# Patient Record
Sex: Female | Born: 1960 | Race: White | Marital: Married | Smoking: Never smoker
Health system: Southern US, Community
[De-identification: ages and names within clinical notes are randomized; demographics above are authoritative.]

## PROBLEM LIST (undated history)

## (undated) DIAGNOSIS — Z83719 Family history of colon polyps, unspecified: Secondary | ICD-10-CM

## (undated) DIAGNOSIS — Z8041 Family history of malignant neoplasm of ovary: Secondary | ICD-10-CM

## (undated) DIAGNOSIS — Z800 Family history of malignant neoplasm of digestive organs: Secondary | ICD-10-CM

## (undated) DIAGNOSIS — E559 Vitamin D deficiency, unspecified: Secondary | ICD-10-CM

## (undated) DIAGNOSIS — Z8371 Family history of colonic polyps: Secondary | ICD-10-CM

## (undated) DIAGNOSIS — J45909 Unspecified asthma, uncomplicated: Secondary | ICD-10-CM

## (undated) HISTORY — DX: Family history of malignant neoplasm of digestive organs: Z80.0

## (undated) HISTORY — DX: Vitamin D deficiency, unspecified: E55.9

## (undated) HISTORY — DX: Family history of colonic polyps: Z83.71

## (undated) HISTORY — DX: Unspecified asthma, uncomplicated: J45.909

## (undated) HISTORY — DX: Family history of colon polyps, unspecified: Z83.719

---
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
## 1990-01-11 HISTORY — PX: AUGMENTATION MAMMAPLASTY: SUR837

## 2013-08-29 DIAGNOSIS — Z531 Procedure and treatment not carried out because of patient's decision for reasons of belief and group pressure: Secondary | ICD-10-CM

## 2013-08-29 DIAGNOSIS — Z789 Other specified health status: Secondary | ICD-10-CM | POA: Insufficient documentation

## 2013-08-29 HISTORY — DX: Procedure and treatment not carried out because of patient's decision for reasons of belief and group pressure: Z53.1

## 2013-08-29 HISTORY — DX: Other specified health status: Z78.9

## 2015-05-05 DIAGNOSIS — M19071 Primary osteoarthritis, right ankle and foot: Secondary | ICD-10-CM

## 2015-05-05 HISTORY — DX: Primary osteoarthritis, right ankle and foot: M19.071

## 2015-09-29 ENCOUNTER — Other Ambulatory Visit: Payer: Self-pay | Admitting: Student

## 2015-09-29 DIAGNOSIS — Z1239 Encounter for other screening for malignant neoplasm of breast: Secondary | ICD-10-CM

## 2017-03-21 DIAGNOSIS — E785 Hyperlipidemia, unspecified: Secondary | ICD-10-CM

## 2017-03-21 DIAGNOSIS — R059 Cough, unspecified: Secondary | ICD-10-CM

## 2017-03-21 DIAGNOSIS — K5900 Constipation, unspecified: Secondary | ICD-10-CM

## 2017-03-21 DIAGNOSIS — R030 Elevated blood-pressure reading, without diagnosis of hypertension: Secondary | ICD-10-CM | POA: Insufficient documentation

## 2017-03-21 DIAGNOSIS — J309 Allergic rhinitis, unspecified: Secondary | ICD-10-CM

## 2017-03-21 HISTORY — DX: Constipation, unspecified: K59.00

## 2017-03-21 HISTORY — DX: Allergic rhinitis, unspecified: J30.9

## 2017-03-21 HISTORY — DX: Elevated blood-pressure reading, without diagnosis of hypertension: R03.0

## 2017-03-21 HISTORY — DX: Hyperlipidemia, unspecified: E78.5

## 2017-03-21 HISTORY — DX: Cough, unspecified: R05.9

## 2017-05-08 DIAGNOSIS — Z1211 Encounter for screening for malignant neoplasm of colon: Secondary | ICD-10-CM | POA: Insufficient documentation

## 2017-05-08 HISTORY — DX: Encounter for screening for malignant neoplasm of colon: Z12.11

## 2017-07-27 DIAGNOSIS — R42 Dizziness and giddiness: Secondary | ICD-10-CM | POA: Insufficient documentation

## 2017-07-27 HISTORY — DX: Dizziness and giddiness: R42

## 2017-09-26 DIAGNOSIS — S92919A Unspecified fracture of unspecified toe(s), initial encounter for closed fracture: Secondary | ICD-10-CM

## 2017-09-26 DIAGNOSIS — M12579 Traumatic arthropathy, unspecified ankle and foot: Secondary | ICD-10-CM

## 2017-09-26 HISTORY — DX: Unspecified fracture of unspecified toe(s), initial encounter for closed fracture: S92.919A

## 2017-09-26 HISTORY — DX: Traumatic arthropathy, unspecified ankle and foot: M12.579

## 2017-11-15 ENCOUNTER — Other Ambulatory Visit: Payer: Self-pay | Admitting: Student

## 2017-11-15 DIAGNOSIS — Z1231 Encounter for screening mammogram for malignant neoplasm of breast: Secondary | ICD-10-CM

## 2017-11-30 ENCOUNTER — Ambulatory Visit: Payer: PRIVATE HEALTH INSURANCE | Attending: Neurology

## 2017-11-30 DIAGNOSIS — G4733 Obstructive sleep apnea (adult) (pediatric): Secondary | ICD-10-CM | POA: Insufficient documentation

## 2018-01-11 HISTORY — PX: COLONOSCOPY: SHX174

## 2018-09-29 ENCOUNTER — Ambulatory Visit (INDEPENDENT_AMBULATORY_CARE_PROVIDER_SITE_OTHER): Payer: Managed Care, Other (non HMO) | Admitting: Obstetrics and Gynecology

## 2018-09-29 ENCOUNTER — Ambulatory Visit (INDEPENDENT_AMBULATORY_CARE_PROVIDER_SITE_OTHER): Payer: Managed Care, Other (non HMO)

## 2018-09-29 ENCOUNTER — Other Ambulatory Visit: Payer: Self-pay

## 2018-09-29 ENCOUNTER — Encounter: Payer: Self-pay | Admitting: Obstetrics and Gynecology

## 2018-09-29 VITALS — BP 120/76 | Ht 68.0 in | Wt 186.0 lb

## 2018-09-29 DIAGNOSIS — Z8041 Family history of malignant neoplasm of ovary: Secondary | ICD-10-CM

## 2018-09-29 DIAGNOSIS — R1031 Right lower quadrant pain: Secondary | ICD-10-CM

## 2018-09-29 DIAGNOSIS — Z23 Encounter for immunization: Secondary | ICD-10-CM

## 2018-09-29 NOTE — Progress Notes (Signed)
Taylor Denis, PA-C   Chief Complaint  Patient presents with  . Pelvic Pain    right ovary x couple months  . Immunizations    flu shot today    HPI:      [name] is a 58 y.o. No obstetric history on file. who LMP was No LMP recorded. (Menstrual status: Other)., presents today for NP eval of RLQ pain for past 2 months. Sx only occur at night when resting/sleeping and feel like mittleschmerz. No pain during the day. Takes aleve with sx releif. Hx of RT LBP with SI joint issues recently, seeing chiro. Also with long-standing constipation but not new for pt. Had appendicitis as a child but still has appendix. No urin sx, vag sx, fevers. No PMB. Not sex active due to vag dryness. Pos FH ovarian cancer in her PGM, genetic testing not done. Pt has [place] insurance and interested in vistaseq. Not current on annual.  Last pap 2018, last mammo 2016. Colonoscopy done 2020, repeat after 5 yrs.   History reviewed. No pertinent past medical history.  Past Surgical History:  Procedure Laterality Date  . AUGMENTATION MAMMAPLASTY  1992    Family History  Problem Relation Age of Onset  . Hypertension Father   . Esophageal cancer Father 73  . Ovarian cancer Paternal Grandmother 57    Social History   Socioeconomic History  . Marital status: Married    Spouse name: Not on file  . Number of children: Not on file  . Years of education: Not on file  . Highest education level: Not on file  Occupational History  . Not on file  Social Needs  . Financial resource strain: Not on file  . Food insecurity    Worry: Not on file    Inability: Not on file  . Transportation needs    Medical: Not on file    Non-medical: Not on file  Tobacco Use  . Smoking status: Never Smoker  . Smokeless tobacco: Never Used  Substance and Sexual Activity  . Alcohol use: Yes  . Drug use: Never  . Sexual activity: Not Currently  Lifestyle  . Physical activity    Days per week: Not on file   Minutes per session: Not on file  . Stress: Not on file  Relationships  . Social Herbalist on phone: Not on file    Gets together: Not on file    Attends religious service: Not on file    Active member of club or organization: Not on file    Attends meetings of clubs or organizations: Not on file    Relationship status: Not on file  . Intimate partner violence    Fear of current or ex partner: Not on file    Emotionally abused: Not on file    Physically abused: Not on file    Forced sexual activity: Not on file  Other Topics Concern  . Not on file  Social History Narrative  . Not on file    Outpatient Medications Prior to Visit  Medication Sig Dispense Refill  . albuterol (VENTOLIN HFA) 108 (90 Base) MCG/ACT inhaler Inhale into the lungs.    . beclomethasone (QVAR) 40 MCG/ACT inhaler Inhale into the lungs.    . fluticasone (FLONASE) 50 MCG/ACT nasal spray Place into the nose.    . neomycin-polymyxin b-dexamethasone (MAXITROL) 3.5-10000-0.1 SUSP INT 1 GTT REY QID FOR 10 DAYS    . Propylene Glycol 0.6 % SOLN  Apply to eye.    . tretinoin (RETIN-A) 0.05 % cream tretinoin 0.05 % topical cream    . White Petrolatum-Mineral Oil (SYSTANE NIGHTTIME) OINT Apply to eye.     No facility-administered medications prior to visit.       ROS:  Review of Systems  Constitutional: Negative for fatigue, fever and unexpected weight change.  Respiratory: Negative for cough, shortness of breath and wheezing.   Cardiovascular: Negative for chest pain, palpitations and leg swelling.  Gastrointestinal: Positive for constipation. Negative for blood in stool, diarrhea, nausea and vomiting.  Endocrine: Negative for cold intolerance, heat intolerance and polyuria.  Genitourinary: Positive for pelvic pain. Negative for dyspareunia, dysuria, flank pain, frequency, genital sores, hematuria, menstrual problem, urgency, vaginal bleeding, vaginal discharge and vaginal pain.  Musculoskeletal:  Positive for back pain. Negative for joint swelling and myalgias.  Skin: Negative for rash.  Neurological: Negative for dizziness, syncope, light-headedness, numbness and headaches.  Hematological: Negative for adenopathy.  Psychiatric/Behavioral: Negative for agitation, confusion, sleep disturbance and suicidal ideas. The patient is not nervous/anxious.   BREAST: No symptoms   OBJECTIVE:   Vitals:  BP 120/76   Ht 5\' 8"  (1.727 m)   Wt 186 lb (84.4 kg)   BMI 28.28 kg/m   Physical Exam Vitals signs reviewed.  Constitutional:      Appearance: She is well-developed.  Neck:     Musculoskeletal: Normal range of motion.  Pulmonary:     Effort: Pulmonary effort is normal.  Abdominal:     Palpations: Abdomen is soft.     Tenderness: There is no abdominal tenderness. There is no guarding or rebound.  Genitourinary:    General: Normal vulva.     Pubic Area: No rash.      Labia:        Right: No rash, tenderness or lesion.        Left: No rash, tenderness or lesion.      Vagina: No tenderness.     Cervix: Normal.     Uterus: Normal. Not enlarged and not tender.      Adnexa: Right adnexa normal and left adnexa normal.       Right: No mass or tenderness.         Left: No mass or tenderness.    Musculoskeletal: Normal range of motion.  Skin:    General: Skin is warm and dry.  Neurological:     General: No focal deficit present.     Mental Status: She is alert and oriented to person, place, and time.  Psychiatric:        Mood and Affect: Mood normal.        Behavior: Behavior normal.        Thought Content: Thought content normal.        Judgment: Judgment normal.     Results:  ULTRASOUND REPORT  Location: [facility]  Date of Service: 09/29/2018    Indications:Pelvic Pain Findings:   The uterus is anteverted and measures 4.9 x 4.1 x 2.8 cm. Echo texture is homogenous without evidence of focal masses. The Endometrium measures 2.6 mm.  Right Ovary measures  1.8 x 1.0 x 1.0 cm. It is normal in appearance. Left Ovary measures 1.8 x 1.2 x 0.9 cm. It is normal in appearance. Survey of the adnexa demonstrates no adnexal masses. There is no free fluid in the cul de sac. There is no backup of urine into the right kidney.  Impression: 1. Normal pelvic ultrasound.   Recommendations:  1.Clinical correlation with the patient's History and Physical Exam.   Gweneth Dimitri, RT  Assessment/Plan: RLQ abdominal pain - Plan: US PELVIS TRANSVAGINAL NON-OB (TV ONLY); Neg exam, neg GYN u/s. Given sx hx, most likely MSK due to SI joint pain. Stretch/heat/ice/chiro. Can refer to pelvic PT if sx persist prn.   Family history of ovarian cancer - Plan: [facility] Hered. Cancer Panel; Genetic testing discussed and done today. Will call with results.  Needs flu shot - Plan: Flu Vaccine QUAD 36+ mos IM (Fluarix, Quad PF)    Return in about 3 weeks (around [id number]) for annual.  Vicki Pasqual B. Jaxtin Raimondo, PA-C 09/29/2018 5:11 PM

## 2018-09-29 NOTE — Patient Instructions (Signed)
I value your feedback and entrusting us with your care. If you get a [facility] patient survey, I would appreciate you taking the time to let us know about your experience today. Thank you! 

## 2018-10-02 ENCOUNTER — Ambulatory Visit: Payer: PRIVATE HEALTH INSURANCE | Admitting: Obstetrics and Gynecology

## 2018-10-03 ENCOUNTER — Telehealth: Payer: Self-pay

## 2018-10-03 NOTE — Telephone Encounter (Signed)
I'm looking for this. Thx

## 2018-10-03 NOTE — Telephone Encounter (Signed)
Taylor [name] w/[facility] regarding genetic testing request. Christella Scheuermann requires patient to be seen by genetic counselor first. They are faxing [name] genetic counseling referral form that needs [name] signature. [id number] 8a-5p EST

## 2018-11-02 ENCOUNTER — Encounter: Payer: Self-pay | Admitting: Obstetrics and Gynecology

## 2018-11-12 DIAGNOSIS — Z8041 Family history of malignant neoplasm of ovary: Secondary | ICD-10-CM

## 2018-11-12 HISTORY — DX: Family history of malignant neoplasm of ovary: Z80.41

## 2018-11-20 ENCOUNTER — Encounter: Payer: Self-pay | Admitting: Obstetrics and Gynecology

## 2018-11-20 LAB — VISTASEQ HERED. CANCER PANEL

## 2018-11-23 ENCOUNTER — Telehealth: Payer: Self-pay | Admitting: Obstetrics and Gynecology

## 2018-11-23 NOTE — Telephone Encounter (Signed)
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 FH ovar cancer. No TC score. No further screening recommended.  Patient understands these results only apply to her and her children, and this is not indicative of genetic testing results of her other family members. It is recommended that her other family members have genetic testing done.  Pt also understands negative genetic testing doesn't mean she will never get any of these cancers.   Results released through [place].

## 2019-01-03 ENCOUNTER — Ambulatory Visit: Payer: Managed Care, Other (non HMO) | Admitting: Obstetrics and Gynecology

## 2019-01-22 ENCOUNTER — Ambulatory Visit: Payer: Managed Care, Other (non HMO) | Admitting: Obstetrics and Gynecology

## 2019-01-22 ENCOUNTER — Other Ambulatory Visit: Payer: Self-pay

## 2019-01-22 ENCOUNTER — Ambulatory Visit (INDEPENDENT_AMBULATORY_CARE_PROVIDER_SITE_OTHER): Payer: Managed Care, Other (non HMO) | Admitting: Obstetrics and Gynecology

## 2019-01-22 ENCOUNTER — Encounter: Payer: Self-pay | Admitting: Obstetrics and Gynecology

## 2019-01-22 VITALS — BP 130/70 | Ht 68.0 in | Wt 195.0 lb

## 2019-01-22 DIAGNOSIS — Z1231 Encounter for screening mammogram for malignant neoplasm of breast: Secondary | ICD-10-CM

## 2019-01-22 DIAGNOSIS — N952 Postmenopausal atrophic vaginitis: Secondary | ICD-10-CM

## 2019-01-22 DIAGNOSIS — N941 Unspecified dyspareunia: Secondary | ICD-10-CM

## 2019-01-22 DIAGNOSIS — Z01419 Encounter for gynecological examination (general) (routine) without abnormal findings: Secondary | ICD-10-CM | POA: Diagnosis not present

## 2019-01-22 MED ORDER — ESTRADIOL 0.1 MG/GM VA CREA: 1.0000 | TOPICAL_CREAM | Freq: Every day | VAGINAL | 1 refills | Status: AC

## 2019-01-22 NOTE — Progress Notes (Signed)
PCP: Wayland Denis, PA-C   Chief Complaint  Patient presents with  . Gynecologic Exam    HPI:      [name] is a 59 y.o. [phone] who LMP was No LMP recorded. (Menstrual status: Other)., presents today for her annual examination.  Her menses are absent due to menopause. No PMB. She does not have vasomotor sx.   Sex activity: not sexually active. She has terrible vaginal dryness and dyspareunia, as well as decreased libido due to pain. Has tried lubricants without relief. Never did vag ERT but interested.  Last Pap: 2018 per pt report  Results were: no abnormalities. No hx of abn paps with bx.  Last mammogram: 09/09/2014 at [facility].  Results were: normal--routine follow-up in 12 months. 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 Pt thinks she has to have u/s with mammo too.  There is no FH of breast cancer. There is a FH of ovarian cancer in her PGM. 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 The patient does do self-breast exams.  Colonoscopy: 2020 without abnormalities; Repeat due after 5 years due to [place] colon polyps   Tobacco use: The patient denies current or previous tobacco use. Alcohol use: social drinker  No drug use. Exercise: moderately active  She does get adequate calcium and Vitamin D in her diet.  Labs with PCP.   Patient Active Problem List   Diagnosis Date Noted  . Family history of ovarian cancer 09/29/2018    Past Surgical History:  Procedure Laterality Date  . AUGMENTATION MAMMAPLASTY  1992  . COLONOSCOPY  2020   at [facility]; repeat after 5 yrs due to [place]    Family History  Problem Relation Age of Onset  . Hypertension Father   . Esophageal cancer Father 35  . Ovarian cancer Paternal Grandmother 68    Social History   Socioeconomic History  . Marital status: Married    Spouse name: Not on file  . Number of children: Not on file  . Years of education: Not on file  . Highest education  level: Not on file  Occupational History  . Not on file  Tobacco Use  . Smoking status: Never Smoker  . Smokeless tobacco: Never Used  Substance and Sexual Activity  . Alcohol use: Yes  . Drug use: Never  . Sexual activity: Yes  Other Topics Concern  . Not on file  Social History Narrative  . Not on file   Social Determinants of Health   Financial Resource Strain:   . Difficulty of Paying Living Expenses: Not on file  Food Insecurity:   . Worried About Charity fundraiser in the Last Year: Not on file  . Ran Out of Food in the Last Year: Not on file  Transportation Needs:   . Lack of Transportation (Medical): Not on file  . Lack of Transportation (Non-Medical): Not on file  Physical Activity:   . Days of Exercise per Week: Not on file  . Minutes of Exercise per Session: Not on file  Stress:   . Feeling of Stress : Not on file  Social Connections:   . Frequency of Communication with Friends and Family: Not on file  . Frequency of Social Gatherings with Friends and Family: Not on file  . Attends Religious Services: Not on file  . Active Member of Clubs or Organizations: Not on file  . Attends Archivist Meetings: Not on file  .  Marital Status: Not on file  Intimate Partner Violence:   . Fear of Current or Ex-Partner: Not on file  . Emotionally Abused: Not on file  . Physically Abused: Not on file  . Sexually Abused: Not on file     Current Outpatient Medications:  .  albuterol (VENTOLIN HFA) 108 (90 Base) MCG/ACT inhaler, Inhale into the lungs., Disp: , Rfl:  .  ergocalciferol (VITAMIN D2) 1.25 MG (50000 UT) capsule, Take by mouth., Disp: , Rfl:  .  fluticasone (FLONASE) 50 MCG/ACT nasal spray, Place into the nose., Disp: , Rfl:  .  fluticasone (FLOVENT HFA) 44 MCG/ACT inhaler, Inhale into the lungs., Disp: , Rfl:  .  tretinoin (RETIN-A) 0.05 % cream, tretinoin 0.05 % topical cream, Disp: , Rfl:  .  White Petrolatum-Mineral Oil (SYSTANE NIGHTTIME) OINT,  Apply to eye., Disp: , Rfl:  .  estradiol (ESTRACE) 0.1 MG/GM vaginal cream, Place 1 Applicatorful vaginally at bedtime. Insert 1g nightly for 1 wk, then 1 g once weekly as maintenace, Disp: 42.5 g, Rfl: 1     ROS:  Review of Systems  Constitutional: Negative for fatigue, fever and unexpected weight change.  Respiratory: Negative for cough, shortness of breath and wheezing.   Cardiovascular: Negative for chest pain, palpitations and leg swelling.  Gastrointestinal: Negative for blood in stool, constipation, diarrhea, nausea and vomiting.  Endocrine: Negative for cold intolerance, heat intolerance and polyuria.  Genitourinary: Positive for dyspareunia. Negative for dysuria, flank pain, frequency, genital sores, hematuria, menstrual problem, pelvic pain, urgency, vaginal bleeding, vaginal discharge and vaginal pain.  Musculoskeletal: Negative for back pain, joint swelling and myalgias.  Skin: Negative for rash.  Neurological: Negative for dizziness, syncope, light-headedness, numbness and headaches.  Hematological: Negative for adenopathy.  Psychiatric/Behavioral: Negative for agitation, confusion, sleep disturbance and suicidal ideas. The patient is not nervous/anxious.   BREAST: No symptoms    Objective: BP 130/70   Ht 5\' 8"  (1.727 m)   Wt 195 lb (88.5 kg)   BMI 29.65 kg/m    Physical Exam Constitutional:      Appearance: She is well-developed.  Genitourinary:     Vulva, vagina, cervix, uterus, right adnexa and left adnexa normal.     No vulval lesion or tenderness noted.     Vaginal atrophic mucosa present.     No vaginal discharge, erythema or tenderness.     No cervical polyp.     Uterus is not enlarged or tender.     No right or left adnexal mass present.     Right adnexa not tender.     Left adnexa not tender.  Neck:     Thyroid: No thyromegaly.  Cardiovascular:     Rate and Rhythm: Normal rate and regular rhythm.     Heart sounds: Normal heart sounds. No murmur.   Pulmonary:     Effort: Pulmonary effort is normal.     Breath sounds: Normal breath sounds.  Chest:     Breasts:        Right: No mass, nipple discharge, skin change or tenderness.        Left: No mass, nipple discharge, skin change or tenderness.  Abdominal:     Palpations: Abdomen is soft.     Tenderness: There is no abdominal tenderness. There is no guarding.  Musculoskeletal:        General: Normal range of motion.     Cervical back: Normal range of motion.  Neurological:     General: No focal deficit present.  Mental Status: She is alert and oriented to person, place, and time.     Cranial Nerves: No cranial nerve deficit.  Skin:    General: Skin is warm and dry.  Psychiatric:        Mood and Affect: Mood normal.        Behavior: Behavior normal.        Thought Content: Thought content normal.        Judgment: Judgment normal.  Vitals reviewed.     Assessment/Plan:  Encounter for annual routine gynecological examination  Encounter for screening mammogram for malignant neoplasm of breast - Plan: 3D MAMMOGRAM SCREENING BILATERAL; pt to sched mammo. May need bilat u/s due to implants but per last [facility] mammo, screening mammo recommended first.  Dyspareunia in female - Plan: estradiol (ESTRACE) 0.1 MG/GM vaginal cream; try vag ERT, add coconut oil. Rx eRxd. F/u prn.  Postmenopausal atrophic vaginitis   Meds ordered this encounter  Medications  . estradiol (ESTRACE) 0.1 MG/GM vaginal cream    Sig: Place 1 Applicatorful vaginally at bedtime. Insert 1g nightly for 1 wk, then 1 g once weekly as maintenace    Dispense:  42.5 g    Refill:  1    Order Specific Question:   Supervising Provider    Answer:   Gae Dry [id number]            GYN counsel breast self exam, mammography screening, menopause, adequate intake of calcium and vitamin D, diet and exercise    F/U  Return in about 1 year (around 01/22/2020).  Cedar Roseman B. Seairra Otani, PA-C 01/23/2019 9:16 AM

## 2019-01-22 NOTE — Patient Instructions (Signed)
I value your feedback and entrusting us with your care. If you get a [facility] patient survey, I would appreciate you taking the time to let us know about your experience today. Thank you!  As of December 21, 2018, your lab results will be released to your MyChart immediately, before I even have a chance to see them. Please give me time to review them and contact you if there are any abnormalities. Thank you for your patience.   [facility]: [phone]  [facility]: [phone]  

## 2019-01-22 NOTE — Progress Notes (Deleted)
PCP: Wayland Denis, PA-C   No chief complaint on file.   HPI:      [name] is a 59 y.o. [phone] who LMP was No LMP recorded. (Menstrual status: Other)., presents today for her annual examination.  Her menses are absent due to menopause. No PMB. She {does:18564} have vasomotor sx.   Sex activity: {sex active:315163}. She {does:18564} have vaginal dryness.  Last Pap: 2018 per pt report  Results were: {norm/abn:16707::"no abnormalities"} /neg HPV DNA.  Hx of STDs: {STD hx:14358}  Last mammogram: 09/09/2014 at [facility].  Results were: normal--routine follow-up in 12 months There is no FH of breast cancer. There is a FH of ovarian cancer in her PGM. 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 The patient {does:18564} do self-breast exams.  Colonoscopy: 2020 with abnormalities; Repeat due after 5 years.   Tobacco use: {tob:20664} Alcohol use: {Alcohol:11675} Exercise: {exercise:31265}  She {does:18564} get adequate calcium and Vitamin D in her diet.  Labs with PCP.   Patient Active Problem List   Diagnosis Date Noted  . Family history of ovarian cancer 09/29/2018    Past Surgical History:  Procedure Laterality Date  . AUGMENTATION MAMMAPLASTY  1992    Family History  Problem Relation Age of Onset  . Hypertension Father   . Esophageal cancer Father 74  . Ovarian cancer Paternal Grandmother 85    Social History   Socioeconomic History  . Marital status: Married    Spouse name: Not on file  . Number of children: Not on file  . Years of education: Not on file  . Highest education level: Not on file  Occupational History  . Not on file  Tobacco Use  . Smoking status: Never Smoker  . Smokeless tobacco: Never Used  Substance and Sexual Activity  . Alcohol use: Yes  . Drug use: Never  . Sexual activity: Not Currently  Other Topics Concern  . Not on file  Social History Narrative  . Not on file   Social Determinants of Health    Financial Resource Strain:   . Difficulty of Paying Living Expenses: Not on file  Food Insecurity:   . Worried About Charity fundraiser in the Last Year: Not on file  . Ran Out of Food in the Last Year: Not on file  Transportation Needs:   . Lack of Transportation (Medical): Not on file  . Lack of Transportation (Non-Medical): Not on file  Physical Activity:   . Days of Exercise per Week: Not on file  . Minutes of Exercise per Session: Not on file  Stress:   . Feeling of Stress : Not on file  Social Connections:   . Frequency of Communication with Friends and Family: Not on file  . Frequency of Social Gatherings with Friends and Family: Not on file  . Attends Religious Services: Not on file  . Active Member of Clubs or Organizations: Not on file  . Attends Archivist Meetings: Not on file  . Marital Status: Not on file  Intimate Partner Violence:   . Fear of Current or Ex-Partner: Not on file  . Emotionally Abused: Not on file  . Physically Abused: Not on file  . Sexually Abused: Not on file     Current Outpatient Medications:  .  albuterol (VENTOLIN HFA) 108 (90 Base) MCG/ACT inhaler, Inhale into the lungs., Disp: , Rfl:  .  beclomethasone (QVAR) 40 MCG/ACT inhaler, Inhale into the lungs., Disp: , Rfl:  .  fluticasone (FLONASE) 50 MCG/ACT nasal spray, Place into the nose., Disp: , Rfl:  .  neomycin-polymyxin b-dexamethasone (MAXITROL) 3.5-10000-0.1 SUSP, INT 1 GTT REY QID FOR 10 DAYS, Disp: , Rfl:  .  Propylene Glycol 0.6 % SOLN, Apply to eye., Disp: , Rfl:  .  tretinoin (RETIN-A) 0.05 % cream, tretinoin 0.05 % topical cream, Disp: , Rfl:  .  White Petrolatum-Mineral Oil (SYSTANE NIGHTTIME) OINT, Apply to eye., Disp: , Rfl:      ROS:  Review of Systems BREAST: No symptoms    Objective: There were no vitals taken for this visit.   OBGyn Exam  Results: No results found for this or any previous visit (from the past 24  hour(s)).  Assessment/Plan:  No diagnosis found.   No orders of the defined types were placed in this encounter.           GYN counsel {counseling:16159}    F/U  No follow-ups on file.  Zalen Sequeira B. Elynore Dolinski, PA-C 01/22/2019 3:35 PM

## 2019-03-26 ENCOUNTER — Encounter: Payer: Self-pay | Admitting: Obstetrics and Gynecology

## 2019-03-26 ENCOUNTER — Other Ambulatory Visit: Payer: Self-pay | Admitting: Obstetrics and Gynecology

## 2019-03-26 DIAGNOSIS — E559 Vitamin D deficiency, unspecified: Secondary | ICD-10-CM

## 2019-03-26 DIAGNOSIS — N941 Unspecified dyspareunia: Secondary | ICD-10-CM

## 2019-03-27 NOTE — Telephone Encounter (Signed)
Can we do this order for pt? Pls let me know if I need to sign anything. Thx. tin

## 2019-03-28 DIAGNOSIS — E559 Vitamin D deficiency, unspecified: Secondary | ICD-10-CM | POA: Insufficient documentation

## 2019-04-03 ENCOUNTER — Other Ambulatory Visit: Payer: Managed Care, Other (non HMO)

## 2019-04-04 LAB — VITAMIN D 25 HYDROXY (VIT D DEFICIENCY, FRACTURES): Vit D, 25-Hydroxy: 34.7 ng/mL (ref 30.0–100.0)

## 2019-04-09 ENCOUNTER — Encounter: Payer: Self-pay | Admitting: Obstetrics and Gynecology

## 2019-04-10 ENCOUNTER — Encounter: Payer: Self-pay | Admitting: Obstetrics and Gynecology

## 2019-05-14 ENCOUNTER — Encounter: Payer: Self-pay | Admitting: Dermatology

## 2019-05-28 ENCOUNTER — Encounter: Payer: Self-pay | Admitting: Dermatology

## 2019-07-09 ENCOUNTER — Other Ambulatory Visit: Payer: Self-pay

## 2019-07-09 ENCOUNTER — Ambulatory Visit: Payer: Managed Care, Other (non HMO) | Admitting: Dermatology

## 2019-07-09 DIAGNOSIS — Z1283 Encounter for screening for malignant neoplasm of skin: Secondary | ICD-10-CM

## 2019-07-09 DIAGNOSIS — L819 Disorder of pigmentation, unspecified: Secondary | ICD-10-CM | POA: Diagnosis not present

## 2019-07-09 DIAGNOSIS — L578 Other skin changes due to chronic exposure to nonionizing radiation: Secondary | ICD-10-CM

## 2019-07-09 DIAGNOSIS — L820 Inflamed seborrheic keratosis: Secondary | ICD-10-CM | POA: Diagnosis not present

## 2019-07-09 DIAGNOSIS — L821 Other seborrheic keratosis: Secondary | ICD-10-CM | POA: Diagnosis not present

## 2019-07-09 DIAGNOSIS — D2239 Melanocytic nevi of other parts of face: Secondary | ICD-10-CM

## 2019-07-09 DIAGNOSIS — L988 Other specified disorders of the skin and subcutaneous tissue: Secondary | ICD-10-CM

## 2019-07-09 DIAGNOSIS — D229 Melanocytic nevi, unspecified: Secondary | ICD-10-CM

## 2019-07-09 DIAGNOSIS — D2272 Melanocytic nevi of left lower limb, including hip: Secondary | ICD-10-CM

## 2019-07-09 DIAGNOSIS — D225 Melanocytic nevi of trunk: Secondary | ICD-10-CM

## 2019-07-09 DIAGNOSIS — D2271 Melanocytic nevi of right lower limb, including hip: Secondary | ICD-10-CM

## 2019-07-09 NOTE — Progress Notes (Signed)
Follow-Up Visit   Subjective  Taylor [name] is a 59 y.o. 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  She has a spot under her breast that gets irritated by her bra.  She also has persistent discoloration on her knees.   The following portions of the chart were reviewed this encounter and updated as appropriate:      Review of Systems:  No other skin or systemic complaints except as noted in HPI or Assessment and Plan.  Objective  Well appearing patient in no apparent distress; mood and affect are within normal limits.  A full examination was performed including scalp, head, eyes, ears, nose, lips, neck, chest, axillae, abdomen, back, buttocks, bilateral upper extremities, bilateral lower extremities, hands, feet, fingers, toes, fingernails, and toenails. All findings within normal limits unless otherwise noted below.  Objective    Right mid back: 2.38mm med dark brown macule  Right 2nd toe: 2.57mm brown macule  Right malar cheek: 2.12mm flesh pap darker center  Left calf: 3.13mm med brown macule  Super Pubic: 2.43mm brown macule  Objective  Right Knee - Anterior: Hyperpigmentation and mild lichenification BL knees  Objective  Right inframammary x 1: Erythematous keratotic or waxy stuck-on papule or plaque.   Objective  face, chest: Rhytides and volume loss face, chest   Assessment & Plan    Seborrheic Keratoses - Stuck-on, waxy, tan-brown papules and plaques  - Discussed benign etiology and prognosis. - Observe - Call for any changes  Melanocytic Nevi - Tan-brown and/or pink-flesh-colored symmetric macules and papules - Benign appearing on exam today - Observation - Call clinic for new or changing moles - Recommend daily use of broad spectrum spf 30+ sunscreen to sun-exposed areas.    Actinic Damage - diffuse scaly erythematous macules with underlying  dyspigmentation chest - Recommend daily broad spectrum sunscreen SPF 30+ to sun-exposed areas, reapply every 2 hours as needed.  - Call for new or changing lesions.  Skin cancer screening performed today. Patient has had genetic testing and is positive for the "melanoma" gene.  Doesn't remember name.  Nevus (5) Right malar cheek; Left calf; Super Pubic; Right mid back; Right 2nd toe  Benign-appearing.  Observation.  Call clinic for new or changing moles.  Recommend daily use of broad spectrum spf 30+ sunscreen to sun-exposed areas.    Post-inflammatory pigmentary changes Right Knee - Anterior  Benign  Discussed OTC Urea, Lactic Acid, or Sal Acid moisturizer qam  Start [facility] Hydroquinone 8%, Tretinoin 0.1%, Kojic acid 1%, Niacinamide 4%, Fluocinolone 0.025% cream, a pea sized amount nightly to dark spots on face for up to 2 months. This cannot be used more than 3 months due to risk of exogenous ochronosis (permanent dark spots). The patient was advised this is not covered by insurance. They will receive an email to check out and the medication will be mailed to their home.   Inflamed seborrheic keratosis Right inframammary x 1  Destruction of lesion - Right inframammary x 1  Destruction method: cryotherapy   Informed consent: discussed and consent obtained   Lesion destroyed using liquid nitrogen: Yes   Region frozen until ice ball extended beyond lesion: Yes   Outcome: patient tolerated procedure well with no complications   Post-procedure details: wound care instructions given    Elastosis of skin face, chest  Start the Perfect A 0.1% cream qhs to face  and chest.  May mix with moisturizer.  Sample of Elta Replenish spf given to pt, use qAM  Return in about 1 year (around 07/08/2020) for TBSE.  I, Othelia Pulling, RMA, am acting as scribe for Brendolyn Patty, MD .  Documentation: I have reviewed the above documentation for accuracy and completeness, and I agree with the  above.  Brendolyn Patty MD

## 2019-07-09 NOTE — Patient Instructions (Addendum)
Melanoma ABCDEs  Melanoma is the most dangerous type of skin cancer, and is the leading cause of death from skin disease.  You are more likely to develop melanoma if you:  Have light-colored skin, light-colored eyes, or red or blond hair  Spend a lot of time in the sun  Tan regularly, either outdoors or in a tanning bed  Have had blistering sunburns, especially during childhood  Have a close family member who has had a melanoma  Have atypical moles or large birthmarks  Early detection of melanoma is key since treatment is typically straightforward and cure rates are extremely high if we catch it early.   The first sign of melanoma is often a change in a mole or a new dark spot.  The ABCDE system is a way of remembering the signs of melanoma.  A for asymmetry:  The two halves do not match. B for border:  The edges of the growth are irregular. C for color:  A mixture of colors are present instead of an even brown color. D for diameter:  Melanomas are usually (but not always) greater than 12mm - the size of a pencil eraser. E for evolution:  The spot keeps changing in size, shape, and color.  Please check your skin once per month between visits. You can use a small mirror in front and a large mirror behind you to keep an eye on the back side or your body.   If you see any new or changing lesions before your next follow-up, please call to schedule a visit.  Please continue daily skin protection including broad spectrum sunscreen SPF 30+ to sun-exposed areas, reapplying every 2 hours as needed when you're outdoors.      Cryotherapy Aftercare  . Wash gently with soap and water everyday.   Marland Kitchen Apply Vaseline and Band-Aid daily until healed.    Instructions for [facility] Medications  One or more of your medications was sent to the [facility] mail order compounding pharmacy. You will receive an email from them and can purchase the medicine through that link. It will then be  mailed to your home at the address you confirmed. If for any reason you do not receive an email from them, please check your spam folder. If you still do not find the email, please let us know.  [phone]

## 2019-09-14 ENCOUNTER — Other Ambulatory Visit: Payer: Self-pay | Admitting: Obstetrics and Gynecology

## 2019-09-14 MED ORDER — LORAZEPAM 0.5 MG PO TABS: 0.5000 mg | ORAL_TABLET | Freq: Three times a day (TID) | ORAL | 0 refills | Status: DC | PRN

## 2019-09-14 NOTE — Progress Notes (Signed)
Rx ativan for travel

## 2019-09-21 ENCOUNTER — Ambulatory Visit: Payer: Self-pay | Admitting: Obstetrics and Gynecology

## 2019-10-27 ENCOUNTER — Emergency Department: Payer: Managed Care, Other (non HMO)

## 2019-10-27 ENCOUNTER — Other Ambulatory Visit: Payer: Self-pay

## 2019-10-27 DIAGNOSIS — Z79899 Other long term (current) drug therapy: Secondary | ICD-10-CM | POA: Insufficient documentation

## 2019-10-27 DIAGNOSIS — J90 Pleural effusion, not elsewhere classified: Secondary | ICD-10-CM | POA: Diagnosis not present

## 2019-10-27 DIAGNOSIS — R0789 Other chest pain: Secondary | ICD-10-CM | POA: Diagnosis present

## 2019-10-27 LAB — BASIC METABOLIC PANEL
Anion gap: 8 (ref 5–15)
BUN: 21 mg/dL — ABNORMAL HIGH (ref 6–20)
CO2: 25 mmol/L (ref 22–32)
Calcium: 8.7 mg/dL — ABNORMAL LOW (ref 8.9–10.3)
Chloride: 106 mmol/L (ref 98–111)
Creatinine, Ser: 0.89 mg/dL (ref 0.44–1.00)
GFR, Estimated: >60 mL/min (ref >60)
Glucose, Bld: 94 mg/dL (ref 70–99)
Potassium: 3.7 mmol/L (ref 3.5–5.1)
Sodium: 139 mmol/L (ref 135–145)

## 2019-10-27 LAB — CBC
HCT: 37.2 % (ref 36.0–46.0)
Hemoglobin: 12.6 g/dL (ref 12.0–15.0)
MCH: 32.8 pg (ref 26.0–34.0)
MCHC: 33.9 g/dL (ref 30.0–36.0)
MCV: 96.9 fL (ref 80.0–100.0)
Platelets: 191 10*3/uL (ref 150–400)
RBC: 3.84 MIL/uL — ABNORMAL LOW (ref 3.87–5.11)
RDW: 11.8 % (ref 11.5–15.5)
WBC: 8.8 10*3/uL (ref 4.0–10.5)
nRBC: 0 % (ref 0.0–0.2)

## 2019-10-27 LAB — TROPONIN I (HIGH SENSITIVITY)
Troponin I (High Sensitivity): 3 ng/L (ref <18)
Troponin I (High Sensitivity): 4 ng/L (ref <18)

## 2019-10-27 NOTE — ED Triage Notes (Signed)
Patient reports chest pain off/on since Monday night, today has been more consistent.  Reports during the week, mortin helped with the pain, but not today.

## 2019-10-28 ENCOUNTER — Emergency Department
Admission: EM | Admit: 2019-10-28 | Discharge: 2019-10-28 | Disposition: A | Discharge status: Home / Self Care | Payer: Managed Care, Other (non HMO) | Attending: Emergency Medicine | Admitting: Emergency Medicine

## 2019-10-28 ENCOUNTER — Encounter: Payer: Self-pay | Admitting: Radiology

## 2019-10-28 ENCOUNTER — Emergency Department: Payer: Managed Care, Other (non HMO)

## 2019-10-28 DIAGNOSIS — R079 Chest pain, unspecified: Secondary | ICD-10-CM

## 2019-10-28 DIAGNOSIS — J90 Pleural effusion, not elsewhere classified: Secondary | ICD-10-CM

## 2019-10-28 DIAGNOSIS — R091 Pleurisy: Secondary | ICD-10-CM

## 2019-10-28 LAB — FIBRIN DERIVATIVES D-DIMER (ARMC ONLY): Fibrin derivatives D-dimer (ARMC): 604.58 ng/mL (FEU) — ABNORMAL HIGH (ref 0.00–499.00)

## 2019-10-28 MED ORDER — IOHEXOL 350 MG/ML SOLN
75.0000 mL | Freq: Once | INTRAVENOUS | Status: AC | PRN
  Administered 2019-10-28: 75 mL via INTRAVENOUS

## 2019-10-28 MED ORDER — LIDOCAINE VISCOUS HCL 2 % MT SOLN
15.0000 mL | Freq: Once | OROMUCOSAL | Status: AC
  Administered 2019-10-28: 15 mL via ORAL
  Filled 2019-10-28: qty 15

## 2019-10-28 MED ORDER — KETOROLAC TROMETHAMINE 30 MG/ML IJ SOLN
30.0000 mg | Freq: Once | INTRAMUSCULAR | Status: AC
  Administered 2019-10-28: 30 mg via INTRAVENOUS
  Filled 2019-10-28: qty 1

## 2019-10-28 MED ORDER — ALUM & MAG HYDROXIDE-SIMETH 200-200-20 MG/5ML PO SUSP
30.0000 mL | Freq: Once | ORAL | Status: AC
  Administered 2019-10-28: 30 mL via ORAL
  Filled 2019-10-28: qty 30

## 2019-10-28 MED ORDER — KETOROLAC TROMETHAMINE 10 MG PO TABS: 10.0000 mg | ORAL_TABLET | Freq: Three times a day (TID) | ORAL | 0 refills | Status: DC | PRN

## 2019-10-28 NOTE — ED Provider Notes (Signed)
[facility] Emergency Department Provider Note   ____________________________________________   I have reviewed the triage vital signs and the nursing notes.   HISTORY  Chief Complaint Chest pain  History limited by: Not Limited   HPI Taylor [name] is a 59 y.o. female who presents to the emergency department today because of concern for chest pain. It is located in her left and central chest. It started 5 days ago. She states that initially she had pain in her left scapula and thought it was due to a neck stretching exercise that she performed. She used a trigger point reliever and started taking motrin. The patient states she then started developing pain in her chest. It is a sharp pain. She has continued to try to take motrin and tylenol without any significant relief today. She says the pain is worse with movement, deep breaths and eating. Did recently travel back from [place]. Has not noticed any leg swelling.   Records reviewed. Per medical record review patient has a history of asthma.   Past Medical History:  Diagnosis Date  . Asthma   . Family history of colonic polyps   . Family history of esophageal cancer   . Family history of ovarian cancer 11/2018   Vistaseq neg except CDK4 VUS  . Vitamin D deficiency     Patient Active Problem List   Diagnosis Date Noted  . Vitamin D deficiency 03/28/2019  . Family history of ovarian cancer 09/29/2018    Past Surgical History:  Procedure Laterality Date  . AUGMENTATION MAMMAPLASTY  1992  . COLONOSCOPY  2020   at [facility]; repeat after 5 yrs due to [place]    Prior to Admission medications   Medication Sig Start Date End Date Taking? Authorizing Provider  albuterol (VENTOLIN HFA) 108 (90 Base) MCG/ACT inhaler Inhale into the lungs. 02/13/16   [provider]  ergocalciferol (VITAMIN D2) 1.25 MG (50000 UT) capsule Take by mouth. 12/21/18   [provider]  estradiol (ESTRACE) 0.1 MG/GM  vaginal cream Place 1 Applicatorful vaginally at bedtime. Insert 1g nightly for 1 wk, then 1 g once weekly as maintenace 6/57/84   Copland, Alicia B, PA-C  fluticasone (FLONASE) 50 MCG/ACT nasal spray Place into the nose. 09/11/09   [provider]  fluticasone (FLOVENT HFA) 44 MCG/ACT inhaler Inhale into the lungs. 12/18/18 12/18/19  [provider]  LORazepam (ATIVAN) 0.5 MG tablet Take 1 tablet (0.5 mg total) by mouth every 8 (eight) hours as needed for anxiety. 06/19/60   Copland, Deirdre Evener, PA-C  tretinoin (RETIN-A) 0.05 % cream tretinoin 0.05 % topical cream    [provider]  White Petrolatum-Mineral Oil (SYSTANE NIGHTTIME) OINT Apply to eye.    [provider]    Allergies Patient has no known allergies.  Family History  Problem Relation Age of Onset  . Hypertension Father   . Esophageal cancer Father 67  . Ovarian cancer Paternal Grandmother 51    Social History Social History   Tobacco Use  . Smoking status: Never Smoker  . Smokeless tobacco: Never Used  Vaping Use  . Vaping Use: Never used  Substance Use Topics  . Alcohol use: Yes  . Drug use: Never    Review of Systems Constitutional: No fever/chills Eyes: No visual changes. ENT: No sore throat. Cardiovascular: Positive for chest pain. Respiratory: Denies shortness of breath. Gastrointestinal: No abdominal pain.  No nausea, no vomiting.  No diarrhea.   Genitourinary: Negative for dysuria. Musculoskeletal: Negative  for back pain. Skin: Negative for rash. Neurological: Negative for headaches, focal weakness or numbness.  ____________________________________________   PHYSICAL EXAM:  VITAL SIGNS: ED Triage Vitals  Enc Vitals Group     BP 10/27/19 2042 132/76     Pulse Rate 10/27/19 2042 68     Resp 10/27/19 2042 18     Temp 10/27/19 2042 (!) 97.5 F (36.4 C)     Temp Source 10/27/19 2042 Oral     SpO2 10/27/19 2042 99 %     Weight 10/27/19 2039 185 lb (83.9 kg)      Height 10/27/19 2039 5\' 8"  (1.727 m)     Head Circumference --      Peak Flow --      Pain Score 10/27/19 2039 7   Constitutional: Alert and oriented.  Eyes: Conjunctivae are normal.  ENT      Head: Normocephalic and atraumatic.      Nose: No congestion/rhinnorhea.      Mouth/Throat: Mucous membranes are moist.      Neck: No stridor. Hematological/Lymphatic/Immunilogical: No cervical lymphadenopathy. Cardiovascular: Normal rate, regular rhythm.  No murmurs, rubs, or gallops.  Respiratory: Normal respiratory effort without tachypnea nor retractions. Breath sounds are clear and equal bilaterally. No wheezes/rales/rhonchi. Gastrointestinal: Soft and non tender. No rebound. No guarding.  Genitourinary: Deferred Musculoskeletal: Normal range of motion in all extremities. No lower extremity edema. Neurologic:  Normal speech and language. No gross focal neurologic deficits are appreciated.  Skin:  Skin is warm, dry and intact. No rash noted. Psychiatric: Mood and affect are normal. Speech and behavior are normal. Patient exhibits appropriate insight and judgment.  ____________________________________________    LABS (pertinent positives/negatives)  Trop hs 3 to 4 CBC wbc 8.8, hgb 12.6, plt 191 BMP wnl except bun 21, ca 8.7  ____________________________________________   EKG  I, Nance Pear, attending physician, personally viewed and interpreted this EKG  EKG Time: 2039 Rate: 71 Rhythm: sinus rhythm Axis: normal Intervals: qtc 428 QRS: narrow, q waves v1, v2, v3 ST changes: no st elevation Impression: abnormal ekg  ____________________________________________    RADIOLOGY  CXR No active cardiopulmonary disease  CT angio No PE. Left pleural effusion. Question atelectasis vs edema. ____________________________________________   PROCEDURES  Procedures  ____________________________________________   INITIAL IMPRESSION / ASSESSMENT AND PLAN / ED  COURSE  Pertinent labs & imaging results that were available during my care of the patient were reviewed by me and considered in my medical decision making (see chart for details).   Patient presented to the emergency department today because of concerns for chest pain.  Differential would be broad including pneumothorax, PE, dissection, pneumonia, esophagitis, gastritis, costochondritis amongst other etiologies.  Troponin negative x2.  Given recent travel D-dimer was sent which was elevated.  This prompted CT angio which not show any PE.  Did however show a small left pleural effusion.  Because of this I do wonder if patient is suffering from pleurisy.  I discussed this with the patient.  This time do not think bacterial component given lack of fever or leukocytosis.  Will plan on discharging with anti-inflammatory.  ____________________________________________   FINAL CLINICAL IMPRESSION(S) / ED DIAGNOSES  Final diagnoses:  Chest pain, unspecified type  Pleurisy  Pleural effusion     Note: This dictation was prepared with Dragon dictation. Any transcriptional errors that result from this process are unintentional     Nance Pear, MD 10/28/19 [phone]

## 2019-10-28 NOTE — Discharge Instructions (Addendum)
Please seek medical attention for any high fevers, chest pain, shortness of breath, change in behavior, persistent vomiting, bloody stool or any other new or concerning symptoms.  

## 2019-11-26 ENCOUNTER — Telehealth: Payer: Self-pay

## 2019-11-26 DIAGNOSIS — Z0000 Encounter for general adult medical examination without abnormal findings: Secondary | ICD-10-CM

## 2019-11-26 DIAGNOSIS — Z131 Encounter for screening for diabetes mellitus: Secondary | ICD-10-CM

## 2019-11-26 DIAGNOSIS — E559 Vitamin D deficiency, unspecified: Secondary | ICD-10-CM

## 2019-11-26 DIAGNOSIS — Z13220 Encounter for screening for lipoid disorders: Secondary | ICD-10-CM

## 2019-11-26 NOTE — Telephone Encounter (Signed)
Orders placed. Pt to sched fasting lab appt at her convenience.

## 2019-11-26 NOTE — Telephone Encounter (Signed)
Pt aware.

## 2019-11-26 NOTE — Telephone Encounter (Signed)
Spoke w/patient. She would like same labs are ordered by her PCP last year and anything additional that [facility] thinks she needs. Her Vit D was low last year. She will get drawn at [facility].

## 2019-11-26 NOTE — Telephone Encounter (Signed)
Patient has scheduled AE w/[facility] 01/2020. She is inquiring if there are any labs that she needs to do ahead of time to be able to discuss at apt? She has [employer] insurance. If you will place order, she can go ahead and get those done. Cb#[phone]

## 2020-01-27 NOTE — Progress Notes (Deleted)
PCP: Wayland Denis, PA-C   No chief complaint on file.   HPI:      Taylor [name] is a 60 y.o. [phone] who LMP was No LMP recorded. (Menstrual status: Other)., presents today for her annual examination.  Her menses are absent due to menopause. No PMB. She does not have vasomotor sx.   Sex activity: not sexually active. She has terrible vaginal dryness and dyspareunia, as well as decreased libido due to pain. Has tried lubricants without relief. Never did vag ERT but interested.  Last Pap: 2018 per pt report  Results were: no abnormalities. No hx of abn paps with bx.  Last mammogram: 04/09/19 at [facility].  Results were: normal--routine follow-up in 12 months. 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 Pt thinks she has to have u/s with mammo too.  There is no FH of breast cancer. There is a FH of ovarian cancer in her PGM. Pt had neg Vistaseq except CDK4 VUS, through [facility]. The patient does do self-breast exams.  Colonoscopy: 2020 without abnormalities; Repeat due after 5 years due to [place] colon polyps   Tobacco use: The patient denies current or previous tobacco use. Alcohol use: social drinker  No drug use. Exercise: moderately active  She does get adequate calcium and Vitamin D in her diet.  Labs with PCP. Has lab orders   Patient Active Problem List   Diagnosis Date Noted  . Vitamin D deficiency 03/28/2019  . Family history of ovarian cancer 09/29/2018    Past Surgical History:  Procedure Laterality Date  . AUGMENTATION MAMMAPLASTY  1992  . COLONOSCOPY  2020   at [facility]; repeat after 5 yrs due to [place]    Family History  Problem Relation Age of Onset  . Hypertension Father   . Esophageal cancer Father 66  . Ovarian cancer Paternal Grandmother 32    Social History   Socioeconomic History  . Marital status: Married    Spouse name: Not on file  . Number of children: Not on file  . Years of education: Not on file  .  Highest education level: Not on file  Occupational History  . Not on file  Tobacco Use  . Smoking status: Never Smoker  . Smokeless tobacco: Never Used  Vaping Use  . Vaping Use: Never used  Substance and Sexual Activity  . Alcohol use: Yes  . Drug use: Never  . Sexual activity: Yes  Other Topics Concern  . Not on file  Social History Narrative  . Not on file   Social Determinants of Health   Financial Resource Strain: Not on file  Food Insecurity: Not on file  Transportation Needs: Not on file  Physical Activity: Not on file  Stress: Not on file  Social Connections: Not on file  Intimate Partner Violence: Not on file     Current Outpatient Medications:  .  albuterol (VENTOLIN HFA) 108 (90 Base) MCG/ACT inhaler, Inhale into the lungs., Disp: , Rfl:  .  ergocalciferol (VITAMIN D2) 1.25 MG (50000 UT) capsule, Take by mouth., Disp: , Rfl:  .  estradiol (ESTRACE) 0.1 MG/GM vaginal cream, Place 1 Applicatorful vaginally at bedtime. Insert 1g nightly for 1 wk, then 1 g once weekly as maintenace, Disp: 42.5 g, Rfl: 1 .  fluticasone (FLONASE) 50 MCG/ACT nasal spray, Place into the nose., Disp: , Rfl:  .  fluticasone (FLOVENT HFA) 44 MCG/ACT inhaler, Inhale into the lungs., Disp: , Rfl:  .  ketorolac (TORADOL) 10 MG tablet, Take 1 tablet (10 mg total) by mouth every 8 (eight) hours as needed for severe pain., Disp: 20 tablet, Rfl: 0 .  LORazepam (ATIVAN) 0.5 MG tablet, Take 1 tablet (0.5 mg total) by mouth every 8 (eight) hours as needed for anxiety., Disp: 20 tablet, Rfl: 0 .  tretinoin (RETIN-A) 0.05 % cream, tretinoin 0.05 % topical cream, Disp: , Rfl:  .  White Petrolatum-Mineral Oil (SYSTANE NIGHTTIME) OINT, Apply to eye., Disp: , Rfl:      ROS:  Review of Systems  Constitutional: Negative for fatigue, fever and unexpected weight change.  Respiratory: Negative for cough, shortness of breath and wheezing.   Cardiovascular: Negative for chest pain, palpitations and leg  swelling.  Gastrointestinal: Negative for blood in stool, constipation, diarrhea, nausea and vomiting.  Endocrine: Negative for cold intolerance, heat intolerance and polyuria.  Genitourinary: Positive for dyspareunia. Negative for dysuria, flank pain, frequency, genital sores, hematuria, menstrual problem, pelvic pain, urgency, vaginal bleeding, vaginal discharge and vaginal pain.  Musculoskeletal: Negative for back pain, joint swelling and myalgias.  Skin: Negative for rash.  Neurological: Negative for dizziness, syncope, light-headedness, numbness and headaches.  Hematological: Negative for adenopathy.  Psychiatric/Behavioral: Negative for agitation, confusion, sleep disturbance and suicidal ideas. The patient is not nervous/anxious.   BREAST: No symptoms    Objective: There were no vitals taken for this visit.   Physical Exam Constitutional:      Appearance: She is well-developed.  Genitourinary:     Vulva normal.     No vaginal discharge, erythema or tenderness.      Right Adnexa: not tender and no mass present.    Left Adnexa: not tender and no mass present.    No cervical polyp.     Uterus is not enlarged or tender.  Breasts:     Right: No mass, nipple discharge, skin change or tenderness.     Left: No mass, nipple discharge, skin change or tenderness.    Neck:     Thyroid: No thyromegaly.  Cardiovascular:     Rate and Rhythm: Normal rate and regular rhythm.     Heart sounds: Normal heart sounds. No murmur heard.   Pulmonary:     Effort: Pulmonary effort is normal.     Breath sounds: Normal breath sounds.  Abdominal:     Palpations: Abdomen is soft.     Tenderness: There is no abdominal tenderness. There is no guarding.  Musculoskeletal:        General: Normal range of motion.     Cervical back: Normal range of motion.  Neurological:     General: No focal deficit present.     Mental Status: She is alert and oriented to person, place, and time.     Cranial  Nerves: No cranial nerve deficit.  Skin:    General: Skin is warm and dry.  Psychiatric:        Mood and Affect: Mood normal.        Behavior: Behavior normal.        Thought Content: Thought content normal.        Judgment: Judgment normal.  Vitals reviewed.     Assessment/Plan:  Encounter for annual routine gynecological examination  Encounter for screening mammogram for malignant neoplasm of breast - Plan: 3D MAMMOGRAM SCREENING BILATERAL; pt to sched mammo. May need bilat u/s due to implants but per last [facility] mammo, screening mammo recommended first.  Dyspareunia in female - Plan: estradiol (ESTRACE) 0.1 MG/GM  vaginal cream; try vag ERT, add coconut oil. Rx eRxd. F/u prn.  Postmenopausal atrophic vaginitis   No orders of the defined types were placed in this encounter.           GYN counsel breast self exam, mammography screening, menopause, adequate intake of calcium and vitamin D, diet and exercise    F/U  No follow-ups on file.  Aneliz Carbary B. Christl Fessenden, PA-C 01/27/2020 3:20 PM

## 2020-01-28 ENCOUNTER — Ambulatory Visit: Payer: Self-pay | Admitting: Obstetrics and Gynecology

## 2020-01-28 DIAGNOSIS — Z1231 Encounter for screening mammogram for malignant neoplasm of breast: Secondary | ICD-10-CM

## 2020-01-28 DIAGNOSIS — Z01419 Encounter for gynecological examination (general) (routine) without abnormal findings: Secondary | ICD-10-CM

## 2020-03-10 ENCOUNTER — Other Ambulatory Visit: Payer: Self-pay

## 2020-03-10 ENCOUNTER — Ambulatory Visit (INDEPENDENT_AMBULATORY_CARE_PROVIDER_SITE_OTHER): Payer: Managed Care, Other (non HMO) | Admitting: Obstetrics and Gynecology

## 2020-03-10 ENCOUNTER — Encounter: Payer: Self-pay | Admitting: Obstetrics and Gynecology

## 2020-03-10 VITALS — BP 120/80 | Ht 68.0 in | Wt 191.0 lb

## 2020-03-10 DIAGNOSIS — E559 Vitamin D deficiency, unspecified: Secondary | ICD-10-CM

## 2020-03-10 DIAGNOSIS — Z0000 Encounter for general adult medical examination without abnormal findings: Secondary | ICD-10-CM

## 2020-03-10 DIAGNOSIS — Z13220 Encounter for screening for lipoid disorders: Secondary | ICD-10-CM

## 2020-03-10 DIAGNOSIS — Z1151 Encounter for screening for human papillomavirus (HPV): Secondary | ICD-10-CM

## 2020-03-10 DIAGNOSIS — Z01419 Encounter for gynecological examination (general) (routine) without abnormal findings: Secondary | ICD-10-CM

## 2020-03-10 DIAGNOSIS — Z124 Encounter for screening for malignant neoplasm of cervix: Secondary | ICD-10-CM

## 2020-03-10 DIAGNOSIS — Z131 Encounter for screening for diabetes mellitus: Secondary | ICD-10-CM

## 2020-03-10 DIAGNOSIS — Z1231 Encounter for screening mammogram for malignant neoplasm of breast: Secondary | ICD-10-CM

## 2020-03-10 DIAGNOSIS — F419 Anxiety disorder, unspecified: Secondary | ICD-10-CM

## 2020-03-10 DIAGNOSIS — N952 Postmenopausal atrophic vaginitis: Secondary | ICD-10-CM

## 2020-03-10 DIAGNOSIS — N941 Unspecified dyspareunia: Secondary | ICD-10-CM

## 2020-03-10 MED ORDER — LORAZEPAM 0.5 MG PO TABS: 0.5000 mg | ORAL_TABLET | Freq: Three times a day (TID) | ORAL | 0 refills | Status: DC | PRN

## 2020-03-10 NOTE — Progress Notes (Signed)
PCP: Wayland Denis, PA-C   Chief Complaint  Patient presents with  . Gynecologic Exam    No concerns    HPI:      [name] is a 60 y.o. [id number] who LMP was No LMP recorded. (Menstrual status: Other)., presents today for her annual examination.  Her menses are absent due to menopause. No PMB. She does not have vasomotor sx.   Sex activity: not sexually active. She has terrible vaginal dryness and dyspareunia, as well as decreased libido due to pain. Has tried lubricants without relief. Tried vag ERT but it gave her headaches. Was using 1 g nightly but couldn't complete the wk due to HA. Tried it twice with same sx.   Last Pap: 2018 per pt report  Results were: no abnormalities. No hx of abn paps with bx.  Last mammogram: 04/09/19 at [facility].  Results were: normal--routine follow-up in 12 months. 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  There is no FH of breast cancer. There is a FH of ovarian cancer in her PGM. Pt is neg Vistaseq except CDK4 VUS, through [place]. The patient does do self-breast exams.  Colonoscopy: 2020 without abnormalities; Repeat due after 5 years due to [place] colon polyps   Tobacco use: The patient denies current or previous tobacco use. Alcohol use: social drinker  No drug use. Exercise: moderately active  She does get adequate calcium and Vitamin D in her diet.  Fasting labs due, order already placed. Hx of Vit D deficiency. Needs Rx RF ativan to take sparingly prn anxiety.  Patient Active Problem List   Diagnosis Date Noted  . Postmenopausal atrophic vaginitis 03/10/2020  . Dyspareunia in female 03/10/2020  . Anxiety 03/10/2020  . Vitamin D deficiency 03/28/2019  . Family history of ovarian cancer 09/29/2018    Past Surgical History:  Procedure Laterality Date  . AUGMENTATION MAMMAPLASTY  1992  . COLONOSCOPY  2020   at [facility]; repeat after 5 yrs due to [place]    Family History  Problem  Relation Age of Onset  . Hypertension Father   . Esophageal cancer Father 91  . Ovarian cancer Paternal Grandmother 70    Social History   Socioeconomic History  . Marital status: Married    Spouse name: Not on file  . Number of children: Not on file  . Years of education: Not on file  . Highest education level: Not on file  Occupational History  . Not on file  Tobacco Use  . Smoking status: Never Smoker  . Smokeless tobacco: Never Used  Vaping Use  . Vaping Use: Never used  Substance and Sexual Activity  . Alcohol use: Yes  . Drug use: Never  . Sexual activity: Yes  Other Topics Concern  . Not on file  Social History Narrative  . Not on file   Social Determinants of Health   Financial Resource Strain: Not on file  Food Insecurity: Not on file  Transportation Needs: Not on file  Physical Activity: Not on file  Stress: Not on file  Social Connections: Not on file  Intimate Partner Violence: Not on file     Current Outpatient Medications:  .  albuterol (VENTOLIN HFA) 108 (90 Base) MCG/ACT inhaler, Inhale into the lungs., Disp: , Rfl:  .  azelastine (ASTELIN) 0.1 % nasal spray, SMARTSIG:1-2 Spray(s) Both Nares Every 12 Hours PRN, Disp: , Rfl:  .  estradiol (ESTRACE) 0.1 MG/GM vaginal cream, Place 1  Applicatorful vaginally at bedtime. Insert 1g nightly for 1 wk, then 1 g once weekly as maintenace, Disp: 42.5 g, Rfl: 1 .  fluticasone (FLONASE) 50 MCG/ACT nasal spray, Place into the nose., Disp: , Rfl:  .  tretinoin (RETIN-A) 0.05 % cream, tretinoin 0.05 % topical cream, Disp: , Rfl:  .  ergocalciferol (VITAMIN D2) 1.25 MG (50000 UT) capsule, Take by mouth. (Patient not taking: Reported on 03/10/2020), Disp: , Rfl:  .  fluticasone (FLOVENT HFA) 44 MCG/ACT inhaler, Inhale into the lungs., Disp: , Rfl:  .  LORazepam (ATIVAN) 0.5 MG tablet, Take 1 tablet (0.5 mg total) by mouth every 8 (eight) hours as needed for anxiety., Disp: 20 tablet, Rfl: 0     ROS:  Review of  Systems  Constitutional: Negative for fatigue, fever and unexpected weight change.  Respiratory: Negative for cough, shortness of breath and wheezing.   Cardiovascular: Negative for chest pain, palpitations and leg swelling.  Gastrointestinal: Negative for blood in stool, constipation, diarrhea, nausea and vomiting.  Endocrine: Negative for cold intolerance, heat intolerance and polyuria.  Genitourinary: Positive for dyspareunia. Negative for dysuria, flank pain, frequency, genital sores, hematuria, menstrual problem, pelvic pain, urgency, vaginal bleeding, vaginal discharge and vaginal pain.  Musculoskeletal: Positive for arthralgias. Negative for back pain, joint swelling and myalgias.  Skin: Negative for rash.  Neurological: Negative for dizziness, syncope, light-headedness, numbness and headaches.  Hematological: Negative for adenopathy.  Psychiatric/Behavioral: Negative for agitation, confusion, sleep disturbance and suicidal ideas. The patient is not nervous/anxious.   BREAST: No symptoms    Objective: BP 120/80   Ht 5\' 8"  (1.727 m)   Wt 191 lb (86.6 kg)   BMI 29.04 kg/m    Physical Exam Constitutional:      Appearance: She is well-developed.  Genitourinary:     Vulva normal.     Right Labia: No rash, tenderness or lesions.    Left Labia: No tenderness, lesions or rash.    No vaginal discharge, erythema or tenderness.      Right Adnexa: not tender and no mass present.    Left Adnexa: not tender and no mass present.    No cervical friability or polyp.     Uterus is not enlarged or tender.  Breasts:     Right: No mass, nipple discharge, skin change or tenderness.     Left: No mass, nipple discharge, skin change or tenderness.    Neck:     Thyroid: No thyromegaly.  Cardiovascular:     Rate and Rhythm: Normal rate and regular rhythm.     Heart sounds: Normal heart sounds. No murmur heard.   Pulmonary:     Effort: Pulmonary effort is normal.     Breath sounds:  Normal breath sounds.  Abdominal:     Palpations: Abdomen is soft.     Tenderness: There is no abdominal tenderness. There is no guarding or rebound.  Musculoskeletal:        General: Normal range of motion.     Cervical back: Normal range of motion.  Lymphadenopathy:     Cervical: No cervical adenopathy.  Neurological:     General: No focal deficit present.     Mental Status: She is alert and oriented to person, place, and time.     Cranial Nerves: No cranial nerve deficit.  Skin:    General: Skin is warm and dry.  Psychiatric:        Mood and Affect: Mood normal.  Behavior: Behavior normal.        Thought Content: Thought content normal.        Judgment: Judgment normal.  Vitals reviewed.     Assessment/Plan: Encounter for annual routine gynecological examination  Cervical cancer screening - Plan: IGP, Aptima HPV  Screening for HPV (human papillomavirus) - Plan: IGP, Aptima HPV  Encounter for screening mammogram for malignant neoplasm of breast - Plan: MM 3D SCREEN BREAST BILATERAL; pt to sched mammo  Dyspareunia in female--try decreased dose of vag ERT or just use a pea size amt ext. Has Rx. F/U prn  Postmenopausal atrophic vaginitis  Anxiety - Plan: LORazepam (ATIVAN) 0.5 MG tablet; Rx RF. Pt uses sparingly.  Blood tests for routine general physical examination - Plan: CBC with Differential/Platelet, VITAMIN D 25 Hydroxy (Vit-D Deficiency, Fractures), Hemoglobin A1c, Lipid panel, Comprehensive metabolic panel  Vitamin D deficiency - Plan: VITAMIN D 25 Hydroxy (Vit-D Deficiency, Fractures)  Screening for diabetes mellitus - Plan: Hemoglobin A1c  Screening cholesterol level - Plan: Lipid panel    Meds ordered this encounter  Medications  . LORazepam (ATIVAN) 0.5 MG tablet    Sig: Take 1 tablet (0.5 mg total) by mouth every 8 (eight) hours as needed for anxiety.    Dispense:  20 tablet    Refill:  0    Order Specific Question:   Supervising Provider     Answer:   Gae Dry [[id number]]            GYN counsel breast self exam, mammography screening, menopause, adequate intake of calcium and vitamin D, diet and exercise    F/U  Return in about 1 year (around 03/10/2021).  Alicia B. Copland, PA-C 03/10/2020 4:34 PM

## 2020-03-10 NOTE — Patient Instructions (Signed)
I value your feedback and you entrusting us with your care. If you get a [facility] patient survey, I would appreciate you taking the time to let us know about your experience today. Thank you! ? ? ?

## 2020-03-13 LAB — IGP, APTIMA HPV: HPV Aptima: NEGATIVE

## 2020-03-19 LAB — CBC WITH DIFFERENTIAL/PLATELET
Basophils Absolute: 0.1 10*3/uL (ref 0.0–0.2)
Basos: 1 %
EOS (ABSOLUTE): 0.2 10*3/uL (ref 0.0–0.4)
Eos: 4 %
Hematocrit: 42.2 % (ref 34.0–46.6)
Hemoglobin: 14.4 g/dL (ref 11.1–15.9)
Immature Grans (Abs): 0 10*3/uL (ref 0.0–0.1)
Immature Granulocytes: 0 %
Lymphocytes Absolute: 2 10*3/uL (ref 0.7–3.1)
Lymphs: 37 %
MCH: 32.8 pg (ref 26.6–33.0)
MCHC: 34.1 g/dL (ref 31.5–35.7)
MCV: 96 fL (ref 79–97)
Monocytes Absolute: 0.6 10*3/uL (ref 0.1–0.9)
Monocytes: 11 %
Neutrophils Absolute: 2.5 10*3/uL (ref 1.4–7.0)
Neutrophils: 47 %
Platelets: 210 10*3/uL (ref 150–450)
RBC: 4.39 x10E6/uL (ref 3.77–5.28)
RDW: 11.7 % (ref 11.7–15.4)
WBC: 5.4 10*3/uL (ref 3.4–10.8)

## 2020-03-19 LAB — LIPID PANEL
Chol/HDL Ratio: 2.9 ratio (ref 0.0–4.4)
Cholesterol, Total: 223 mg/dL — ABNORMAL HIGH (ref 100–199)
HDL: 76 mg/dL (ref >39)
LDL Chol Calc (NIH): 127 mg/dL — ABNORMAL HIGH (ref 0–99)
Triglycerides: 116 mg/dL (ref 0–149)
VLDL Cholesterol Cal: 20 mg/dL (ref 5–40)

## 2020-03-19 LAB — COMPREHENSIVE METABOLIC PANEL
ALT: 13 IU/L (ref 0–32)
AST: 13 IU/L (ref 0–40)
Albumin/Globulin Ratio: 1.6 (ref 1.2–2.2)
Albumin: 4.3 g/dL (ref 3.8–4.9)
Alkaline Phosphatase: 98 IU/L (ref 44–121)
BUN/Creatinine Ratio: 17 (ref 9–23)
BUN: 16 mg/dL (ref 6–24)
Bilirubin Total: 0.5 mg/dL (ref 0.0–1.2)
CO2: 19 mmol/L — ABNORMAL LOW (ref 20–29)
Calcium: 9.2 mg/dL (ref 8.7–10.2)
Chloride: 105 mmol/L (ref 96–106)
Creatinine, Ser: 0.93 mg/dL (ref 0.57–1.00)
Globulin, Total: 2.7 g/dL (ref 1.5–4.5)
Glucose: 91 mg/dL (ref 65–99)
Potassium: 4.7 mmol/L (ref 3.5–5.2)
Sodium: 140 mmol/L (ref 134–144)
Total Protein: 7 g/dL (ref 6.0–8.5)
eGFR: 71 mL/min/{1.73_m2} (ref >59)

## 2020-03-19 LAB — HEMOGLOBIN A1C
Est. average glucose Bld gHb Est-mCnc: 100 mg/dL
Hgb A1c MFr Bld: 5.1 % (ref 4.8–5.6)

## 2020-03-19 LAB — VITAMIN D 25 HYDROXY (VIT D DEFICIENCY, FRACTURES): Vit D, 25-Hydroxy: 38.5 ng/mL (ref 30.0–100.0)

## 2020-08-12 ENCOUNTER — Encounter: Payer: Self-pay | Admitting: Obstetrics and Gynecology

## 2020-09-18 ENCOUNTER — Ambulatory Visit: Payer: Managed Care, Other (non HMO) | Admitting: Dermatology

## 2020-09-18 ENCOUNTER — Other Ambulatory Visit: Payer: Self-pay

## 2020-09-18 DIAGNOSIS — L578 Other skin changes due to chronic exposure to nonionizing radiation: Secondary | ICD-10-CM | POA: Diagnosis not present

## 2020-09-18 DIAGNOSIS — L821 Other seborrheic keratosis: Secondary | ICD-10-CM

## 2020-09-18 DIAGNOSIS — Z1283 Encounter for screening for malignant neoplasm of skin: Secondary | ICD-10-CM | POA: Diagnosis not present

## 2020-09-18 DIAGNOSIS — L814 Other melanin hyperpigmentation: Secondary | ICD-10-CM

## 2020-09-18 DIAGNOSIS — D1800 Hemangioma unspecified site: Secondary | ICD-10-CM

## 2020-09-18 DIAGNOSIS — D229 Melanocytic nevi, unspecified: Secondary | ICD-10-CM | POA: Diagnosis not present

## 2020-09-18 DIAGNOSIS — D225 Melanocytic nevi of trunk: Secondary | ICD-10-CM

## 2020-09-18 DIAGNOSIS — D485 Neoplasm of uncertain behavior of skin: Secondary | ICD-10-CM

## 2020-09-18 NOTE — Progress Notes (Signed)
Follow-Up Visit   Subjective  Taylor [name] is a 60 y.o. female who presents for the following: Annual Exam (Patient here for full body skin exam and skin cancer screening. Patient with probable hx of dysplastic nevi. Will request records from previous dermatologist. Patient does have a new spot at left side that came up about 1 year ago and has gotten larger. ).    The following portions of the chart were reviewed this encounter and updated as appropriate:       Review of Systems:  No other skin or systemic complaints except as noted in HPI or Assessment and Plan.  Objective  Well appearing patient in no apparent distress; mood and affect are within normal limits.  A full examination was performed including scalp, head, eyes, ears, nose, lips, neck, chest, axillae, abdomen, back, buttocks, bilateral upper extremities, bilateral lower extremities, hands, feet, fingers, toes, fingernails, and toenails. All findings within normal limits unless otherwise noted below.   Mid Back Right mid back: 2.57m med dark brown macule Right 2nd toe: 2.053mbrown macule Right malar cheek: 2.57m87mlesh pap darker center Left calf: 3.0mm57md brown macule Super Pubic: 2.0mm 69mwn macule    left lateral breast 0.4mm m28mum dark brown papule        Assessment & Plan  Nevus Mid Back  Benign-appearing.  Stable. Observation.  Call clinic for new or changing lesions.  Recommend daily use of broad spectrum spf 30+ sunscreen to sun-exposed areas.    Neoplasm of uncertain behavior of skin left lateral breast  Epidermal / dermal shaving  Lesion diameter (cm):  0.6 Informed consent: discussed and consent obtained   Patient was prepped and draped in usual sterile fashion: Area prepped with alcohol. Anesthesia: the lesion was anesthetized in a standard fashion   Anesthetic:  1% lidocaine w/ epinephrine 1-100,000 buffered w/ 8.4% NaHCO3 Instrument used: flexible razor blade   Hemostasis  achieved with: pressure, aluminum chloride and electrodesiccation   Outcome: patient tolerated procedure well   Post-procedure details: wound care instructions given   Post-procedure details comment:  Ointment and small bandage applied.   Anatomic Pathology Report  Lentigines - Scattered tan macules - Due to sun exposure - Benign-appering, observe - Recommend daily broad spectrum sunscreen SPF 30+ to sun-exposed areas, reapply every 2 hours as needed. - Call for any changes  Seborrheic Keratoses - Stuck-on, waxy, tan-brown papules and/or plaques  - Benign-appearing - Discussed benign etiology and prognosis. - Observe - Call for any changes  Melanocytic Nevi - Tan-brown and/or pink-flesh-colored symmetric macules and papules - Benign appearing on exam today - Observation - Call clinic for new or changing moles - Recommend daily use of broad spectrum spf 30+ sunscreen to sun-exposed areas.   Hemangiomas - Red papules - Discussed benign nature - Observe - Call for any changes  Actinic Damage - Chronic condition, secondary to cumulative UV/sun exposure - diffuse scaly erythematous macules with underlying dyspigmentation - Recommend daily broad spectrum sunscreen SPF 30+ to sun-exposed areas, reapply every 2 hours as needed.  - Staying in the shade or wearing long sleeves, sun glasses (UVA+UVB protection) and wide brim hats (4-inch brim around the entire circumference of the hat) are also recommended for sun protection.  - Call for new or changing lesions.  Skin cancer screening performed today. Return in about 1 year (around 09/18/2021) for TBSE.  I,JackGraciella Belton am acting as scribe for Vera Wishart SBrendolyn Patty  Documentation: I have reviewed the above documentation for  accuracy and completeness, and I agree with the above.  Brendolyn Patty MD

## 2020-09-18 NOTE — Patient Instructions (Addendum)
[facility] [phone]   Wound Care Instructions  Cleanse wound gently with soap and water once a day then pat dry with clean gauze. Apply a thing coat of Petrolatum (petroleum jelly, "Vaseline") over the wound (unless you have an allergy to this). We recommend that you use a new, sterile tube of Vaseline. Do not pick or remove scabs. Do not remove the yellow or white "healing tissue" from the base of the wound.  Cover the wound with fresh, clean, nonstick gauze and secure with paper tape. You may use Band-Aids in place of gauze and tape if the would is small enough, but would recommend trimming much of the tape off as there is often too much. Sometimes Band-Aids can irritate the skin.  You should call the office for your biopsy report after 1 week if you have not already been contacted.  If you experience any problems, such as abnormal amounts of bleeding, swelling, significant bruising, significant pain, or evidence of infection, please call the office immediately.  FOR ADULT SURGERY PATIENTS: If you need something for pain relief you may take 1 extra strength Tylenol (acetaminophen) AND 2 Ibuprofen ('200mg'$  each) together every 4 hours as needed for pain. (do not take these if you are allergic to them or if you have a reason you should not take them.) Typically, you may only need pain medication for 1 to 3 days.   Melanoma ABCDEs  Melanoma is the most dangerous type of skin cancer, and is the leading cause of death from skin disease.  You are more likely to develop melanoma if you: Have light-colored skin, light-colored eyes, or red or blond hair Spend a lot of time in the sun Tan regularly, either outdoors or in a tanning bed Have had blistering sunburns, especially during childhood Have a close family member who has had a melanoma Have atypical moles or large birthmarks  Early detection of melanoma is key since treatment is typically straightforward and cure rates  are extremely high if we catch it early.   The first sign of melanoma is often a change in a mole or a new dark spot.  The ABCDE system is a way of remembering the signs of melanoma.  A for asymmetry:  The two halves do not match. B for border:  The edges of the growth are irregular. C for color:  A mixture of colors are present instead of an even brown color. D for diameter:  Melanomas are usually (but not always) greater than 4m - the size of a pencil eraser. E for evolution:  The spot keeps changing in size, shape, and color.  Please check your skin once per month between visits. You can use a small mirror in front and a large mirror behind you to keep an eye on the back side or your body.   If you see any new or changing lesions before your next follow-up, please call to schedule a visit.  Please continue daily skin protection including broad spectrum sunscreen SPF 30+ to sun-exposed areas, reapplying every 2 hours as needed when you're outdoors.    If you have any questions or concerns for your doctor, please call our main line at [phone] press option 4 to reach your doctor's medical assistant. If no one answers, please leave a voicemail as directed and we will return your call as soon as possible. Messages left after 4 pm will be answered the following business day.   You may also  send [place] a message via MyChart. We typically respond to MyChart messages within 1-2 business days.  For prescription refills, please ask your pharmacy to contact our office. Our fax number is [phone].  If you have an urgent issue when the clinic is closed that cannot wait until the next business day, you can page your doctor at the number below.    Please note that while we do our best to be available for urgent issues outside of office hours, we are not available 24/7.   If you have an urgent issue and are unable to reach [place], you may choose to seek medical care at your doctor's office, retail  clinic, urgent care center, or emergency room.  If you have a medical emergency, please immediately call 911 or go to the emergency department.  Pager Numbers  - Dr. Nehemiah Massed: [phone]  - Dr. Laurence Ferrari: [phone]  - Dr. Nicole Kindred: [phone]  In the event of inclement weather, please call our main line at [phone] for an update on the status of any delays or closures.  Dermatology Medication Tips: Please keep the boxes that topical medications come in in order to help keep track of the instructions about where and how to use these. Pharmacies typically print the medication instructions only on the boxes and not directly on the medication tubes.   If your medication is too expensive, please contact our office at [phone] option 4 or send [place] a message through [place].   We are unable to tell what your co-pay for medications will be in advance as this is different depending on your insurance coverage. However, we may be able to find a substitute medication at lower cost or fill out paperwork to get insurance to cover a needed medication.   If a prior authorization is required to get your medication covered by your insurance company, please allow [place] 1-2 business days to complete this process.  Drug prices often vary depending on where the prescription is filled and some pharmacies may offer cheaper prices.  The website www.goodrx.com contains coupons for medications through different pharmacies. The prices here do not account for what the cost may be with help from insurance (it may be cheaper with your insurance), but the website can give you the price if you did not use any insurance.  - You can print the associated coupon and take it with your prescription to the pharmacy.  - You may also stop by our office during regular business hours and pick up a GoodRx coupon card.  - If you need your prescription sent electronically to a different pharmacy, notify our office through [facility] or by phone at [phone] option 4.

## 2020-09-23 ENCOUNTER — Encounter: Payer: Self-pay | Admitting: Obstetrics and Gynecology

## 2020-09-24 ENCOUNTER — Other Ambulatory Visit: Payer: Self-pay | Admitting: Obstetrics and Gynecology

## 2020-09-24 DIAGNOSIS — F419 Anxiety disorder, unspecified: Secondary | ICD-10-CM

## 2020-09-24 MED ORDER — LORAZEPAM 0.5 MG PO TABS: 0.5000 mg | ORAL_TABLET | Freq: Three times a day (TID) | ORAL | 0 refills | Status: AC | PRN

## 2020-09-24 NOTE — Progress Notes (Signed)
Rx RF ativan prn flying.

## 2020-09-30 ENCOUNTER — Telehealth: Payer: Self-pay

## 2020-09-30 LAB — ANATOMIC PATHOLOGY REPORT

## 2020-09-30 NOTE — Telephone Encounter (Signed)
-----   Message from Brendolyn Patty, MD sent at 09/30/2020  5:20 PM EDT ----- Benign irritated nevus - please call patient

## 2020-09-30 NOTE — Telephone Encounter (Signed)
Advised patient biopsy was benign.

## 2021-02-13 IMAGING — CR DG CHEST 2V
1 series · 2 of 2 positions shown · non-contrast
Comparison: None.

CLINICAL DATA: Chest pain

EXAM:
CHEST - 2 VIEW

[Series 1: dg chest 2 view · 0.14mm/px · 2 of 2 slices shown]
[im 1/2]
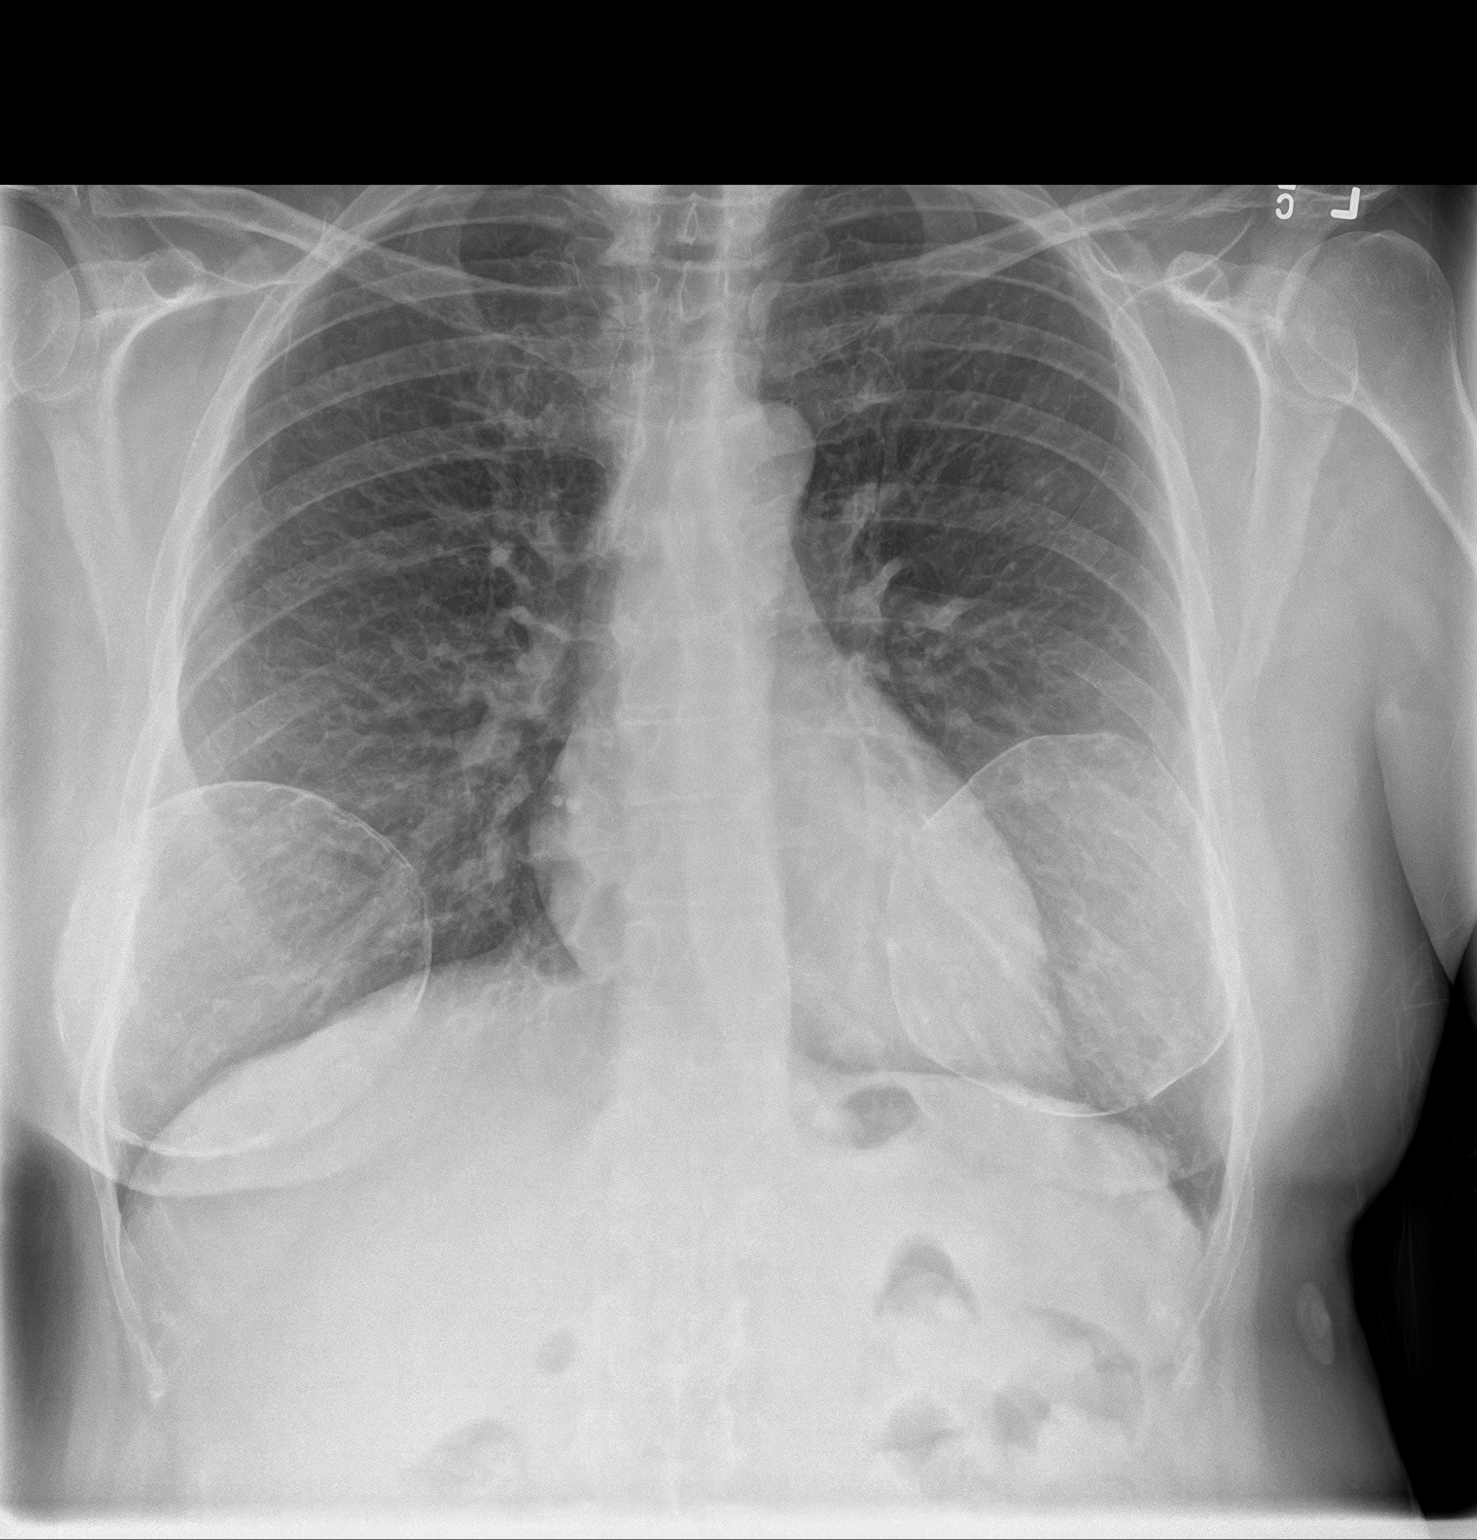
[im 2/2]
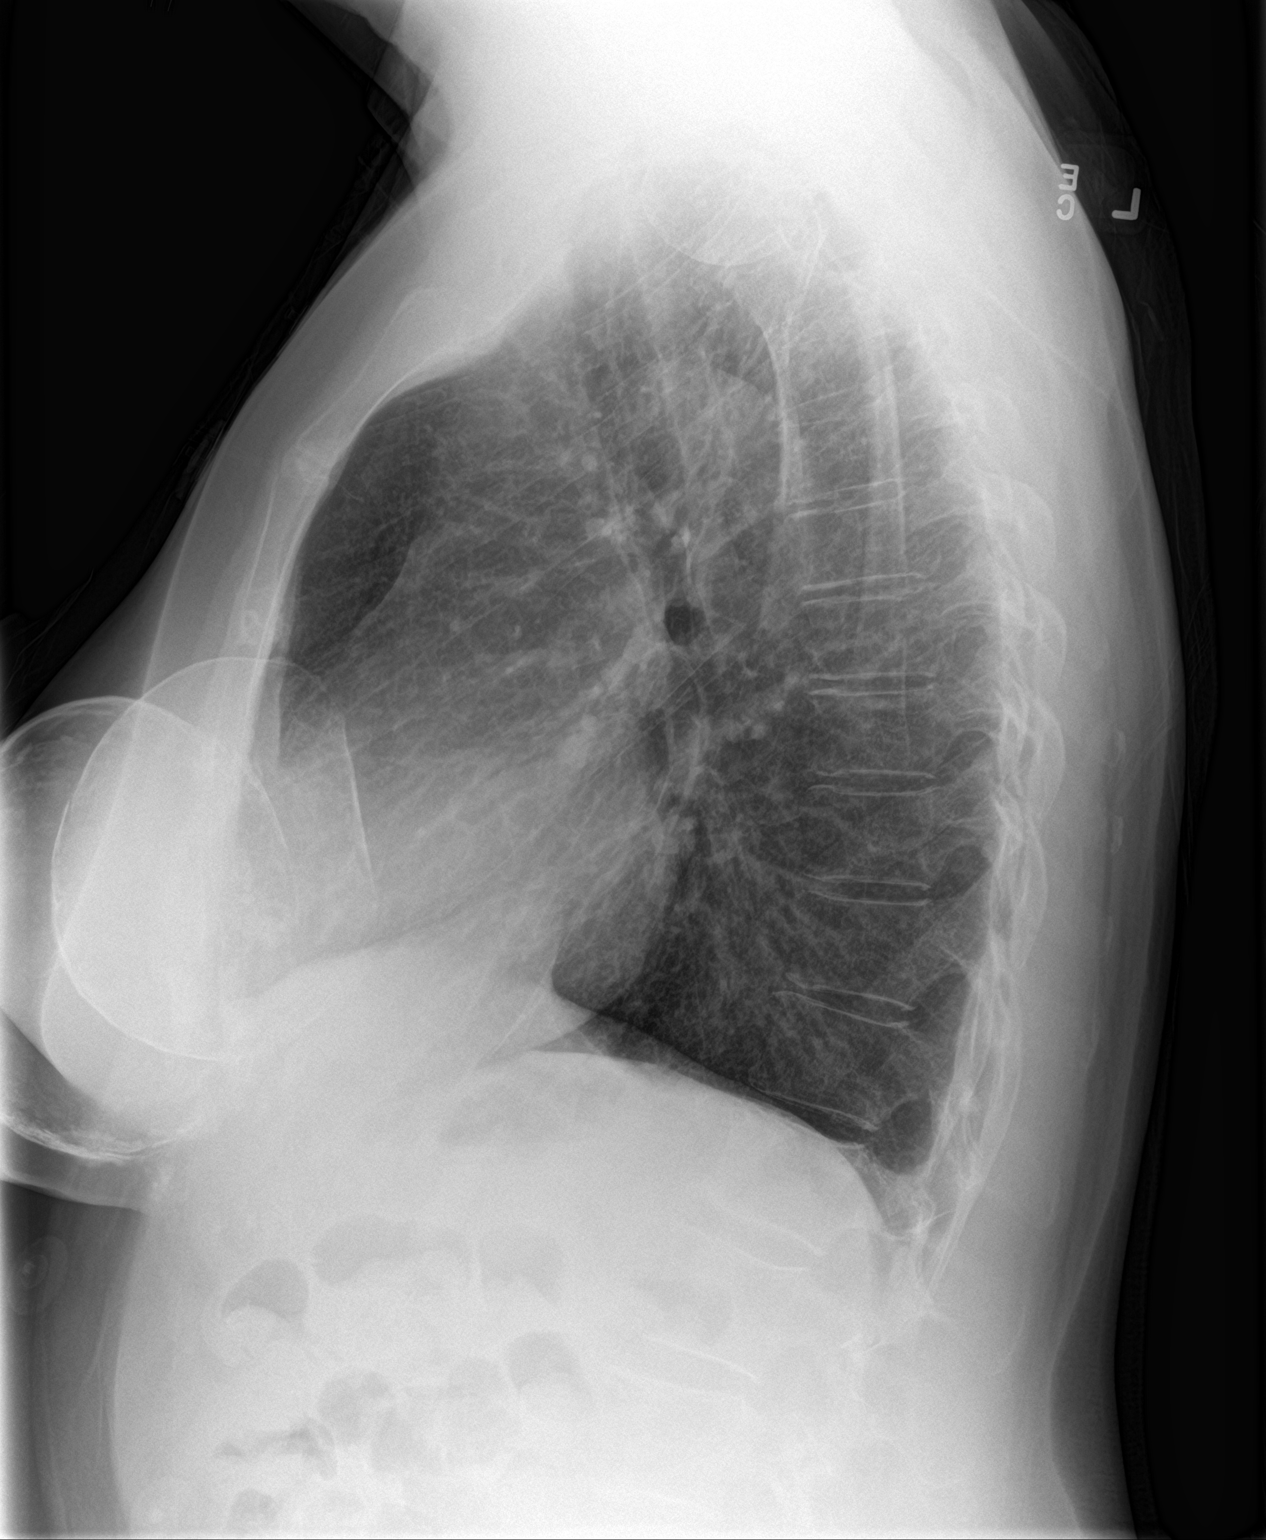

[2 of 2 positions shown; findings below may reference images not displayed]

FINDINGS: Heart and mediastinal contours are within normal limits. No focal
opacities or effusions. No acute bony abnormality. Bilateral
calcified breast implants.
IMPRESSION: No active cardiopulmonary disease.

## 2021-09-21 ENCOUNTER — Ambulatory Visit (INDEPENDENT_AMBULATORY_CARE_PROVIDER_SITE_OTHER): Payer: Managed Care, Other (non HMO) | Admitting: Dermatology

## 2021-09-21 DIAGNOSIS — L814 Other melanin hyperpigmentation: Secondary | ICD-10-CM

## 2021-09-21 DIAGNOSIS — D2239 Melanocytic nevi of other parts of face: Secondary | ICD-10-CM

## 2021-09-21 DIAGNOSIS — D2272 Melanocytic nevi of left lower limb, including hip: Secondary | ICD-10-CM | POA: Diagnosis not present

## 2021-09-21 DIAGNOSIS — L578 Other skin changes due to chronic exposure to nonionizing radiation: Secondary | ICD-10-CM

## 2021-09-21 DIAGNOSIS — Z1283 Encounter for screening for malignant neoplasm of skin: Secondary | ICD-10-CM | POA: Diagnosis not present

## 2021-09-21 DIAGNOSIS — D2271 Melanocytic nevi of right lower limb, including hip: Secondary | ICD-10-CM

## 2021-09-21 DIAGNOSIS — L988 Other specified disorders of the skin and subcutaneous tissue: Secondary | ICD-10-CM

## 2021-09-21 DIAGNOSIS — D229 Melanocytic nevi, unspecified: Secondary | ICD-10-CM

## 2021-09-21 DIAGNOSIS — L821 Other seborrheic keratosis: Secondary | ICD-10-CM

## 2021-09-21 DIAGNOSIS — D225 Melanocytic nevi of trunk: Secondary | ICD-10-CM | POA: Diagnosis not present

## 2021-09-21 DIAGNOSIS — D1800 Hemangioma unspecified site: Secondary | ICD-10-CM

## 2021-09-21 NOTE — Progress Notes (Signed)
Follow-Up Visit   Subjective  Taylor [name] is a 61 y.o. female who presents for the following: Annual Exam (1 year tbse, hx of isks). The patient presents for Total-Body Skin Exam (TBSE) for skin cancer screening and mole check.  The patient has spots, moles and lesions to be evaluated, some may be new or changing and the patient has concerns that these could be cancer.  The following portions of the chart were reviewed this encounter and updated as appropriate:  Tobacco  Allergies  Meds  Problems  Med Hx  Surg Hx  Fam Hx     Review of Systems: No other skin or systemic complaints except as noted in HPI or Assessment and Plan.  Objective  Well appearing patient in no apparent distress; mood and affect are within normal limits.  A full examination was performed including scalp, head, eyes, ears, nose, lips, neck, chest, axillae, abdomen, back, buttocks, bilateral upper extremities, bilateral lower extremities, hands, feet, fingers, toes, fingernails, and toenails. All findings within normal limits unless otherwise noted below.  Head - Anterior (Face) Rhytides and volume loss.   right mid back 2.0 mm med dark brown macule   right 2nd toe 2 mm brown macule   Right Malar Cheek 2.5 mm flesh pap darker center  left calf 3 mm med brown macule   Suprapubic Area 2 mm brown macule           Assessment & Plan  Elastosis of skin Head - Anterior (Face)  Continue Retin A 0.1 % cream qhs  For treatment of sun damage changes and brown spots at face  Discussed the treatment option of BBL/laser.  Typically we recommend 1-3 treatment sessions about 5-8 weeks apart for best results.  The patient's condition may require "maintenance treatments" in the future.  The fee for BBL / laser treatments is $350 per treatment session for the whole face.  A fee can be quoted for other parts of the body. Insurance typically does not pay for BBL/laser treatments and therefore the fee  is an out-of-pocket cost.  Nevus (5) left calf; Suprapubic Area; right mid back; right 2nd toe; Right Malar Cheek See photos Benign-appearing.  Observation.  Call clinic for new or changing lesions.  Recommend daily use of broad spectrum spf 30+ sunscreen to sun-exposed areas.   Lentigines - Scattered tan macules - Due to sun exposure - Benign-appearing, observe - Recommend daily broad spectrum sunscreen SPF 30+ to sun-exposed areas, reapply every 2 hours as needed. - Call for any changes  Seborrheic Keratoses - Stuck-on, waxy, tan-brown papules and/or plaques  - Benign-appearing - Discussed benign etiology and prognosis. - Observe - Call for any changes  Melanocytic Nevi - Tan-brown and/or pink-flesh-colored symmetric macules and papules - Benign appearing on exam today - Observation - Call clinic for new or changing moles - Recommend daily use of broad spectrum spf 30+ sunscreen to sun-exposed areas.   Hemangiomas - Red papules - Discussed benign nature - Observe - Call for any changes  Actinic Damage - Chronic condition, secondary to cumulative UV/sun exposure - diffuse scaly erythematous macules with underlying dyspigmentation - Recommend daily broad spectrum sunscreen SPF 30+ to sun-exposed areas, reapply every 2 hours as needed.  - Staying in the shade or wearing long sleeves, sun glasses (UVA+UVB protection) and wide brim hats (4-inch brim around the entire circumference of the hat) are also recommended for sun protection.  - Call for new or changing lesions.  Skin cancer screening performed  today. Return in about 1 year (around 09/22/2022) for TBSE. IRuthell Rummage, CMA, am acting as scribe for Sarina Ser, MD. Documentation: I have reviewed the above documentation for accuracy and completeness, and I agree with the above.  Sarina Ser, MD

## 2021-09-21 NOTE — Patient Instructions (Addendum)
Continue Retin A 0.1 % as directed  For light  Discussed the treatment option of BBL/laser.  Typically we recommend 1-3 treatment sessions about 5-8 weeks apart for best results.  The patient's condition may require "maintenance treatments" in the future.  The fee for BBL / laser treatments is $350 per treatment session for the whole face.  A fee can be quoted for other parts of the body. Insurance typically does not pay for BBL/laser treatments and therefore the fee is an out-of-pocket cost.     Seborrheic Keratosis  What causes seborrheic keratoses? Seborrheic keratoses are harmless, common skin growths that first appear during adult life.  As time goes by, more growths appear.  Some people may develop a large number of them.  Seborrheic keratoses appear on both covered and uncovered body parts.  They are not caused by sunlight.  The tendency to develop seborrheic keratoses can be inherited.  They vary in color from skin-colored to gray, brown, or even black.  They can be either smooth or have a rough, warty surface.   Seborrheic keratoses are superficial and look as if they were stuck on the skin.  Under the microscope this type of keratosis looks like layers upon layers of skin.  That is why at times the top layer may seem to fall off, but the rest of the growth remains and re-grows.    Treatment Seborrheic keratoses do not need to be treated, but can easily be removed in the office.  Seborrheic keratoses often cause symptoms when they rub on clothing or jewelry.  Lesions can be in the way of shaving.  If they become inflamed, they can cause itching, soreness, or burning.  Removal of a seborrheic keratosis can be accomplished by freezing, burning, or surgery. If any spot bleeds, scabs, or grows rapidly, please return to have it checked, as these can be an indication of a skin cancer.     Melanoma ABCDEs  Melanoma is the most dangerous type of skin cancer, and is the leading cause of  death from skin disease.  You are more likely to develop melanoma if you: Have light-colored skin, light-colored eyes, or red or blond hair Spend a lot of time in the sun Tan regularly, either outdoors or in a tanning bed Have had blistering sunburns, especially during childhood Have a close family member who has had a melanoma Have atypical moles or large birthmarks  Early detection of melanoma is key since treatment is typically straightforward and cure rates are extremely high if we catch it early.   The first sign of melanoma is often a change in a mole or a new dark spot.  The ABCDE system is a way of remembering the signs of melanoma.  A for asymmetry:  The two halves do not match. B for border:  The edges of the growth are irregular. C for color:  A mixture of colors are present instead of an even brown color. D for diameter:  Melanomas are usually (but not always) greater than 88m - the size of a pencil eraser. E for evolution:  The spot keeps changing in size, shape, and color.  Please check your skin once per month between visits. You can use a small mirror in front and a large mirror behind you to keep an eye on the back side or your body.   If you see any new or changing lesions before your next follow-up, please call to schedule a visit.  Please continue daily  skin protection including broad spectrum sunscreen SPF 30+ to sun-exposed areas, reapplying every 2 hours as needed when you're outdoors.   Staying in the shade or wearing long sleeves, sun glasses (UVA+UVB protection) and wide brim hats (4-inch brim around the entire circumference of the hat) are also recommended for sun protection.     Due to recent changes in healthcare laws, you may see results of your pathology and/or laboratory studies on MyChart before the doctors have had a chance to review them. We understand that in some cases there may be results that are confusing or concerning to you. Please understand that  not all results are received at the same time and often the doctors may need to interpret multiple results in order to provide you with the best plan of care or course of treatment. Therefore, we ask that you please give [place] 2 business days to thoroughly review all your results before contacting the office for clarification. Should we see a critical lab result, you will be contacted sooner.   If You Need Anything After Your Visit  If you have any questions or concerns for your doctor, please call our main line at [phone] and press option 4 to reach your doctor's medical assistant. If no one answers, please leave a voicemail as directed and we will return your call as soon as possible. Messages left after 4 pm will be answered the following business day.   You may also send [place] a message via [place]. We typically respond to MyChart messages within 1-2 business days.  For prescription refills, please ask your pharmacy to contact our office. Our fax number is [phone].  If you have an urgent issue when the clinic is closed that cannot wait until the next business day, you can page your doctor at the number below.    Please note that while we do our best to be available for urgent issues outside of office hours, we are not available 24/7.   If you have an urgent issue and are unable to reach [place], you may choose to seek medical care at your doctor's office, retail clinic, urgent care center, or emergency room.  If you have a medical emergency, please immediately call 911 or go to the emergency department.  Pager Numbers  - Dr. Nehemiah Massed: [phone]  - Dr. Laurence Ferrari: [phone]  - Dr. Nicole Kindred: [phone]  In the event of inclement weather, please call our main line at [phone] for an update on the status of any delays or closures.  Dermatology Medication Tips: Please keep the boxes that topical medications come in in order to help keep track of the instructions about where and how  to use these. Pharmacies typically print the medication instructions only on the boxes and not directly on the medication tubes.   If your medication is too expensive, please contact our office at [phone] option 4 or send [place] a message through [place].   We are unable to tell what your co-pay for medications will be in advance as this is different depending on your insurance coverage. However, we may be able to find a substitute medication at lower cost or fill out paperwork to get insurance to cover a needed medication.   If a prior authorization is required to get your medication covered by your insurance company, please allow [place] 1-2 business days to complete this process.  Drug prices often vary depending on where the prescription is filled and some pharmacies may offer cheaper prices.  The website  www.goodrx.com contains coupons for medications through different pharmacies. The prices here do not account for what the cost may be with help from insurance (it may be cheaper with your insurance), but the website can give you the price if you did not use any insurance.  - You can print the associated coupon and take it with your prescription to the pharmacy.  - You may also stop by our office during regular business hours and pick up a GoodRx coupon card.  - If you need your prescription sent electronically to a different pharmacy, notify our office through [facility] or by phone at [phone] option 4.     Si Usted Necesita Algo Despus de Su Visita  Tambin puede enviarnos un mensaje a travs de Pharmacist, community. Por lo general respondemos a los mensajes de MyChart en el transcurso de 1 a 2 das hbiles.  Para renovar recetas, por favor pida a su farmacia que se ponga en contacto con nuestra oficina. Harland Dingwall de fax es [place] [phone].  Si tiene un asunto urgente cuando la clnica est cerrada y que no puede esperar hasta el siguiente da hbil, puede llamar/localizar a su  doctor(a) al nmero que aparece a continuacin.   Por favor, tenga en cuenta que aunque hacemos todo lo posible para estar disponibles para asuntos urgentes fuera del horario de [place], no estamos disponibles las 24 horas del da, los 7 das de la [place].   Si tiene un problema urgente y no puede comunicarse con nosotros, puede optar por buscar atencin mdica  en el consultorio de su doctor(a), en una clnica privada, en un centro de atencin urgente o en una sala de emergencias.  Si tiene Engineering geologist, por favor llame inmediatamente al 911 o vaya a la sala de emergencias.  Nmeros de bper  - Dr. Nehemiah Massed: [phone]  - Dra. Moye: [phone]  - Dra. Nicole Kindred: [phone]  En caso de inclemencias del [place], por favor llame a Johnsie Kindred principal al [phone] para una actualizacin sobre el [place] de cualquier retraso o cierre.  Consejos para la medicacin en dermatologa: Por favor, guarde las cajas en las que vienen los medicamentos de uso tpico para ayudarle a seguir las instrucciones sobre dnde y cmo usarlos. Las farmacias generalmente imprimen las instrucciones del medicamento slo en las cajas y no directamente en los tubos del [place].   Si su medicamento es muy caro, por favor, pngase en contacto con Zigmund Daniel llamando al [phone] y presione la opcin 4 o envenos un mensaje a travs de Pharmacist, community.   No podemos decirle cul ser su copago por los medicamentos por adelantado ya que esto es diferente dependiendo de la cobertura de su seguro. Sin embargo, es posible que podamos encontrar un medicamento sustituto a Electrical engineer un formulario para que el seguro cubra el medicamento que se considera necesario.   Si se requiere una autorizacin previa para que su compaa de seguros [place] su medicamento, por favor permtanos de 1 a 2 das hbiles para completar este proceso.  Los precios de los medicamentos varan con frecuencia dependiendo del  Environmental consultant de dnde se surte la receta y alguna farmacias pueden ofrecer precios ms baratos.  El sitio web www.goodrx.com tiene cupones para medicamentos de Airline pilot. Los precios aqu no tienen en cuenta lo que podra costar con la ayuda del seguro (puede ser ms barato con su seguro), pero el sitio web puede darle el precio si no utiliz Research scientist (physical sciences).  - Puede imprimir  imprimir el cupn correspondiente y llevarlo con su receta a la farmacia.  - Tambin puede pasar por nuestra oficina durante el horario de atencin regular y recoger una tarjeta de cupones de GoodRx.  - Si necesita que su receta se enve electrnicamente a una farmacia diferente, informe a nuestra oficina a travs de MyChart de [place] o por telfono llamando al [phone] y presione la opcin 4.  

## 2021-09-24 ENCOUNTER — Encounter: Payer: Self-pay | Admitting: Dermatology

## 2022-10-07 ENCOUNTER — Encounter: Payer: Managed Care, Other (non HMO) | Admitting: Dermatology

## 2022-10-21 ENCOUNTER — Encounter: Payer: Self-pay | Admitting: Dermatology

## 2022-10-21 ENCOUNTER — Ambulatory Visit (INDEPENDENT_AMBULATORY_CARE_PROVIDER_SITE_OTHER): Payer: Managed Care, Other (non HMO) | Admitting: Dermatology

## 2022-10-21 DIAGNOSIS — Z872 Personal history of diseases of the skin and subcutaneous tissue: Secondary | ICD-10-CM

## 2022-10-21 DIAGNOSIS — L578 Other skin changes due to chronic exposure to nonionizing radiation: Secondary | ICD-10-CM | POA: Diagnosis not present

## 2022-10-21 DIAGNOSIS — L821 Other seborrheic keratosis: Secondary | ICD-10-CM

## 2022-10-21 DIAGNOSIS — W908XXA Exposure to other nonionizing radiation, initial encounter: Secondary | ICD-10-CM

## 2022-10-21 DIAGNOSIS — D2239 Melanocytic nevi of other parts of face: Secondary | ICD-10-CM

## 2022-10-21 DIAGNOSIS — Z1283 Encounter for screening for malignant neoplasm of skin: Secondary | ICD-10-CM

## 2022-10-21 DIAGNOSIS — L814 Other melanin hyperpigmentation: Secondary | ICD-10-CM

## 2022-10-21 DIAGNOSIS — D229 Melanocytic nevi, unspecified: Secondary | ICD-10-CM

## 2022-10-21 DIAGNOSIS — D225 Melanocytic nevi of trunk: Secondary | ICD-10-CM

## 2022-10-21 DIAGNOSIS — Z86018 Personal history of other benign neoplasm: Secondary | ICD-10-CM

## 2022-10-21 NOTE — Patient Instructions (Signed)

## 2022-10-21 NOTE — Progress Notes (Signed)
   Follow-Up Visit   Subjective  Taylor [name] is a 62 y.o. female who presents for the following: Skin Cancer Screening and Full Body Skin Exam  The patient presents for Total-Body Skin Exam (TBSE) for skin cancer screening and mole check. The patient has spots, moles and lesions to be evaluated, some may be new or changing and the patient may have concern these could be cancer.  Patient with hx of AK's.   The following portions of the chart were reviewed this encounter and updated as appropriate: medications, allergies, medical history  Review of Systems:  No other skin or systemic complaints except as noted in HPI or Assessment and Plan.  Objective  Well appearing patient in no apparent distress; mood and affect are within normal limits.  A full examination was performed including scalp, head, eyes, ears, nose, lips, neck, chest, axillae, abdomen, back, buttocks, bilateral upper extremities, bilateral lower extremities, hands, feet, fingers, toes, fingernails, and toenails. All findings within normal limits unless otherwise noted below.   Relevant physical exam findings are noted in the Assessment and Plan.    Assessment & Plan   SKIN CANCER SCREENING PERFORMED TODAY.  ACTINIC DAMAGE - Chronic condition, secondary to cumulative UV/sun exposure - diffuse scaly erythematous macules with underlying dyspigmentation - Recommend daily broad spectrum sunscreen SPF 30+ to sun-exposed areas, reapply every 2 hours as needed.  - Staying in the shade or wearing long sleeves, sun glasses (UVA+UVB protection) and wide brim hats (4-inch brim around the entire circumference of the hat) are also recommended for sun protection.  - Call for new or changing lesions.  LENTIGINES, SEBORRHEIC KERATOSES, HEMANGIOMAS - Benign normal skin lesions - Benign-appearing - Call for any changes  MELANOCYTIC NEVI - Tan-brown and/or pink-flesh-colored symmetric macules and papules - Benign appearing on  exam today - Observation - Call clinic for new or changing moles - Recommend daily use of broad spectrum spf 30+ sunscreen to sun-exposed areas.   -right mid back 2.0 mm med dark brown macule    -Right Malar Cheek 2.5 mm flesh pap darker center   Benign-appearing. Stable compared to previous visit. Observation.  Call clinic for new or changing moles.  Recommend daily use of broad spectrum spf 30+ sunscreen to sun-exposed areas.   History of possible Dysplastic Nevi with Dr. Cheree Ditto - No evidence of recurrence today at right tricep and buttocks - Recommend regular full body skin exams - Recommend daily broad spectrum sunscreen SPF 30+ to sun-exposed areas, reapply every 2 hours as needed.  - Call if any new or changing lesions are noted between office visits    Return in about 1 year (around 10/21/2023) for TBSE, Hx AK.  Anise Salvo, RMA, am acting as scribe for Armida Sans, MD .   Documentation: I have reviewed the above documentation for accuracy and completeness, and I agree with the above.  Armida Sans, MD

## 2022-10-26 ENCOUNTER — Encounter: Payer: Self-pay | Admitting: Dermatology

## 2022-11-05 ENCOUNTER — Telehealth: Payer: Self-pay

## 2022-11-05 NOTE — Telephone Encounter (Signed)
Patient called and left an voicemail wanting to schedule. I called the patient back letting her know we received her voicemail and schedule her appointment with Mrs. Gerre Pebbles on 12/13/22 at 10:45 am.

## 2022-12-07 ENCOUNTER — Other Ambulatory Visit: Payer: Self-pay

## 2022-12-12 NOTE — Progress Notes (Unsigned)
Celso Amy, PA-C [address]  [address]  [place], [place] [address]  Main: [phone]  Fax: [phone]   Gastroenterology Consultation  Referring Provider:     Carren Rang, PA-C Primary Care Physician:  Dortha Kern, MD Primary Gastroenterologist:  *** Reason for Consultation:     Dysphagia        HPI:   Taylor [name] is a 62 y.o. y/o female referred for consultation & management  by Dortha Kern, MD.  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; repeat after 5 yrs due to FH    Prior to Admission medications   Medication Sig Start Date End Date Taking? Authorizing Provider  albuterol (VENTOLIN HFA) 108 (90 Base) MCG/ACT inhaler Inhale into the lungs. 02/13/16   [provider]  azelastine (ASTELIN) 0.1 % nasal spray SMARTSIG:1-2 Spray(s) Both Nares Every 12 Hours PRN 11/15/19   [provider]  cyclobenzaprine (FLEXERIL) 5 MG tablet Take 1 tablet by mouth 3 (three) times daily as needed.    [provider]  diazepam (VALIUM) 5 MG tablet TAKE 1 TO 2 TABS BY MOUTH TWICE DAILY AS NEEDED FOR ANXIETY 1 HOUR BEFORE FLIGHT    [provider]   ergocalciferol (VITAMIN D2) 1.25 MG (50000 UT) capsule Take by mouth. Patient not taking: Reported on 03/10/2020 12/21/18   [provider]  estradiol (ESTRACE) 0.1 MG/GM vaginal cream Place 1 Applicatorful vaginally at bedtime. Insert 1g nightly for 1 wk, then 1 g once weekly as maintenace 01/22/19   Copland, Alicia B, PA-C  fluticasone (FLONASE) 50 MCG/ACT nasal spray Place into the nose. 09/11/09   [provider]  fluticasone (FLOVENT HFA) 44 MCG/ACT inhaler Inhale into the lungs. 12/18/18 12/18/19  [provider]  LORazepam (ATIVAN) 0.5 MG tablet Take 1 tablet (0.5 mg total) by mouth every 8 (eight) hours as needed for anxiety. 09/24/20   Copland, Ilona Sorrel, PA-C  predniSONE (DELTASONE) 10 MG tablet Take 30 mg by mouth daily. 10/25/22   [provider]  tretinoin (RETIN-A) 0.05 % cream tretinoin 0.05 % topical cream    [provider]    Family History  Problem Relation Age of Onset   Hypertension Father    Esophageal cancer Father 26   Ovarian cancer Paternal Grandmother 70     Social History   Tobacco Use   Smoking status: Never   Smokeless tobacco: Never  Vaping Use   Vaping status: Never Used  Substance Use Topics   Alcohol use: Yes   Drug use: Never    Allergies as of 12/13/2022   (No Known Allergies)  Review of Systems:    All systems reviewed and negative except where noted in HPI.   Physical Exam:  There were no vitals taken for this visit. No LMP recorded. (Menstrual status: Other).  General:   Alert,  Well-developed, well-nourished, pleasant and cooperative in NAD Lungs:  Respirations even and unlabored.  Clear throughout to auscultation.   No wheezes, crackles, or rhonchi. No acute distress. Heart:  Regular rate and rhythm; no murmurs, clicks, rubs, or gallops. Abdomen:  Normal bowel sounds.  No bruits.  Soft, and non-distended without masses, hepatosplenomegaly or hernias noted.  No Tenderness.  No guarding or  rebound tenderness.    Neurologic:  Alert and oriented x3;  grossly normal neurologically. Psych:  Alert and cooperative. Normal mood and affect.  Imaging Studies: No results found.  Assessment and Plan:   Taylor [name] is a 62 y.o. y/o female has been referred for ***  Follow up ***  Celso Amy, PA-C    BP check ***

## 2022-12-13 ENCOUNTER — Ambulatory Visit: Payer: Self-pay | Admitting: Physician Assistant

## 2023-06-20 ENCOUNTER — Telehealth: Payer: Self-pay

## 2023-06-20 NOTE — Telephone Encounter (Signed)
 Pt requesting call back to schedule colonoscopy.

## 2023-06-20 NOTE — Telephone Encounter (Signed)
 okay

## 2023-07-04 ENCOUNTER — Ambulatory Visit: Admitting: Dermatology

## 2023-07-25 ENCOUNTER — Ambulatory Visit: Admitting: Dermatology

## 2023-08-12 DIAGNOSIS — Z01812 Encounter for preprocedural laboratory examination: Secondary | ICD-10-CM | POA: Diagnosis not present

## 2023-11-03 ENCOUNTER — Ambulatory Visit: Payer: Managed Care, Other (non HMO) | Admitting: Dermatology

## 2023-11-10 ENCOUNTER — Ambulatory Visit (INDEPENDENT_AMBULATORY_CARE_PROVIDER_SITE_OTHER): Admitting: Dermatology

## 2023-11-10 DIAGNOSIS — Z86018 Personal history of other benign neoplasm: Secondary | ICD-10-CM | POA: Diagnosis not present

## 2023-11-10 DIAGNOSIS — W908XXA Exposure to other nonionizing radiation, initial encounter: Secondary | ICD-10-CM

## 2023-11-10 DIAGNOSIS — D225 Melanocytic nevi of trunk: Secondary | ICD-10-CM | POA: Diagnosis not present

## 2023-11-10 DIAGNOSIS — L570 Actinic keratosis: Secondary | ICD-10-CM | POA: Diagnosis not present

## 2023-11-10 DIAGNOSIS — L821 Other seborrheic keratosis: Secondary | ICD-10-CM | POA: Diagnosis not present

## 2023-11-10 DIAGNOSIS — D229 Melanocytic nevi, unspecified: Secondary | ICD-10-CM

## 2023-11-10 DIAGNOSIS — L814 Other melanin hyperpigmentation: Secondary | ICD-10-CM | POA: Diagnosis not present

## 2023-11-10 DIAGNOSIS — L905 Scar conditions and fibrosis of skin: Secondary | ICD-10-CM | POA: Diagnosis not present

## 2023-11-10 DIAGNOSIS — Z1283 Encounter for screening for malignant neoplasm of skin: Secondary | ICD-10-CM

## 2023-11-10 DIAGNOSIS — L578 Other skin changes due to chronic exposure to nonionizing radiation: Secondary | ICD-10-CM | POA: Diagnosis not present

## 2023-11-10 NOTE — Patient Instructions (Signed)

## 2023-11-10 NOTE — Progress Notes (Signed)
   Follow-Up Visit   Subjective  Taylor [name] is a 63 y.o. female who presents for the following: Skin Cancer Screening and Full Body Skin Exam, hx of Dysplastic nevus   The patient presents for Total-Body Skin Exam (TBSE) for skin cancer screening and mole check. The patient has spots, moles and lesions to be evaluated, some may be new or changing and the patient may have concern these could be cancer.  The following portions of the chart were reviewed this encounter and updated as appropriate: medications, allergies, medical history  Review of Systems:  No other skin or systemic complaints except as noted in HPI or Assessment and Plan.  Objective  Well appearing patient in no apparent distress; mood and affect are within normal limits.  A full examination was performed including scalp, head, eyes, ears, nose, lips, neck, chest, axillae, abdomen, back, buttocks, bilateral upper extremities, bilateral lower extremities, hands, feet, fingers, toes, fingernails, and toenails. All findings within normal limits unless otherwise noted below.   Relevant physical exam findings are noted in the Assessment and Plan.  right medial cheek Erythematous thin papules/macules with gritty scale.      Assessment & Plan   SKIN CANCER SCREENING PERFORMED TODAY.  LENTIGINES, SEBORRHEIC KERATOSES, HEMANGIOMAS - Benign normal skin lesions - Benign-appearing - Call for any changes  MELANOCYTIC NEVI See photos- right breast  - Tan-brown and/or pink-flesh-colored symmetric macules and papules - Benign appearing on exam today - Observation - Call clinic for new or changing moles - Recommend daily use of broad spectrum spf 30+ sunscreen to sun-exposed areas.   SCAR Hypopigmented burn scar- left anterior deltoid  Observe   History of possible Dysplastic Nevi with Dr. Arlyss - No evidence of recurrence today at right tricep and buttocks - Recommend regular full body skin exams - Recommend daily  broad spectrum sunscreen SPF 30+ to sun-exposed areas, reapply every 2 hours as needed.  - Call if any new or changing lesions are noted between office visits  AK (ACTINIC KERATOSIS) right medial cheek ACTINIC DAMAGE - chronic, secondary to cumulative UV radiation exposure/sun exposure over time - diffuse scaly erythematous macules with underlying dyspigmentation - Recommend daily broad spectrum sunscreen SPF 30+ to sun-exposed areas, reapply every 2 hours as needed.  - Recommend staying in the shade or wearing long sleeves, sun glasses (UVA+UVB protection) and wide brim hats (4-inch brim around the entire circumference of the hat). - Call for new or changing lesions.   Patient decline LN2 treatment today she will be going for laser treatment in [facility] next week.  Will evaluate after laser treatment to see if persistent and in need of treatment.   Return in about 6 months (around 05/10/2024) for recheck-right cheek and TBSE in 12 months.  IFay Kirks, CMA, am acting as scribe for Alm Rhyme, MD .   Documentation: I have reviewed the above documentation for accuracy and completeness, and I agree with the above.  Alm Rhyme, MD

## 2023-11-16 ENCOUNTER — Encounter: Payer: Self-pay | Admitting: Dermatology

## 2023-11-28 DIAGNOSIS — Z1389 Encounter for screening for other disorder: Secondary | ICD-10-CM | POA: Diagnosis not present

## 2024-01-16 ENCOUNTER — Telehealth: Payer: Self-pay

## 2024-01-16 NOTE — Telephone Encounter (Signed)
 The patient called to schedule an appointment based on a referral. She was informed that there is no current referral on file and that the only referral in the system is from last year. The patient stated that a referral was sent in September; however, she was advised that all referrals from September have already been processed. The patient stated she will contact her PCP to submit a new referral for an EGD and colonoscopy.

## 2024-01-24 ENCOUNTER — Ambulatory Visit: Admitting: Dermatology

## 2024-01-24 ENCOUNTER — Encounter: Payer: Self-pay | Admitting: Dermatology

## 2024-01-24 ENCOUNTER — Other Ambulatory Visit: Payer: Self-pay

## 2024-01-24 DIAGNOSIS — R21 Rash and other nonspecific skin eruption: Secondary | ICD-10-CM

## 2024-01-24 DIAGNOSIS — W908XXA Exposure to other nonionizing radiation, initial encounter: Secondary | ICD-10-CM | POA: Diagnosis not present

## 2024-01-24 DIAGNOSIS — L570 Actinic keratosis: Secondary | ICD-10-CM

## 2024-01-24 DIAGNOSIS — D2239 Melanocytic nevi of other parts of face: Secondary | ICD-10-CM

## 2024-01-24 DIAGNOSIS — L578 Other skin changes due to chronic exposure to nonionizing radiation: Secondary | ICD-10-CM

## 2024-01-24 DIAGNOSIS — Z7189 Other specified counseling: Secondary | ICD-10-CM

## 2024-01-24 DIAGNOSIS — Z79899 Other long term (current) drug therapy: Secondary | ICD-10-CM

## 2024-01-24 MED ORDER — MOMETASONE FUROATE 0.1 % EX CREA
TOPICAL_CREAM | CUTANEOUS | 0 refills | Status: AC
  Filled 2024-01-24: qty 45, 30d supply, fill #0

## 2024-01-24 NOTE — Progress Notes (Signed)
 "  Follow-Up Visit   Subjective  Taylor [name] is a 64 y.o. female who presents for the following: The patient has spots, moles and lesions to be evaluated, some may be new or changing and the patient may have concern these could be cancer.  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  She was scheduled to have CO2 laser soon after, so declined treatment at that time.  The CO2 laser did not resolve spot, so she returns for re-evaluation. Also, a couple days ago she developed a spot on L chest.  The following portions of the chart were reviewed this encounter and updated as appropriate: medications, allergies, medical history  Review of Systems:  No other skin or systemic complaints except as noted in HPI or Assessment and Plan.  Objective  Well appearing patient in no apparent distress; mood and affect are within normal limits.   A focused examination was performed of the following areas: face, chest   Relevant exam findings are noted in the Assessment and Plan.  right cheek beside a nevus.  Dr Raymund and I evaluated with dermoscopy and appears benign.  Not consistent with skin cancer on exam today. Stuck-on, waxy, tan-brown papules -- Discussed benign etiology and prognosis.       Assessment & Plan   RASH BITE vs ISK  Left  chest  See photo  Start Mometasone  cream apply bid  If not gone in 2 months send a mychart message  If not resolved will re-evaluate in office.  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 Recommend observation. Call clinic for new or changing moles. Recommend daily use of broad spectrum spf 30+ sunscreen to sun-exposed areas.   ACTINIC DAMAGE - chronic, secondary to cumulative UV radiation exposure/sun exposure over time - diffuse scaly erythematous macules with underlying dyspigmentation - Recommend daily broad spectrum sunscreen SPF 30+ to  sun-exposed areas, reapply every 2 hours as needed.  - Recommend staying in the shade or wearing long sleeves, sun glasses (UVA+UVB protection) and wide brim hats (4-inch brim around the entire circumference of the hat). - Call for new or changing lesions.   ACTINIC KERATOSIS right cheek beside a nevus.  Dr Raymund and I evaluated with dermoscopy and appears benign.  Not consistent with skin cancer on exam today. Symptomatic, irritating, patient would like treated.  - Destruction of lesion - right cheek beside a nevus.  Dr Raymund and I evaluated with dermoscopy and appears benign.  Not consistent with skin cancer on exam today. Complexity: simple   Destruction method: cryotherapy   Informed consent: discussed and consent obtained   Timeout:  patient name, date of birth, surgical site, and procedure verified Lesion destroyed using liquid nitrogen: Yes   Region frozen until ice ball extended beyond lesion: Yes   Outcome: patient tolerated procedure well with no complications   Post-procedure details: wound care instructions given    ACTINIC SKIN DAMAGE   RASH   COUNSELING AND COORDINATION OF CARE   MEDICATION MANAGEMENT    Pt to contact us  through MyChart in 2 mos to let us  know how this spot is doing. See photos.  Return if symptoms worsen or fail to improve. Will determine follow up after pt contacts us  in 2 mos through MyChart.  LILLETTE Fay Kirks, CMA, am acting as scribe for Alm Rhyme, MD .   Documentation: I have  reviewed the above documentation for accuracy and completeness, and I agree with the above.  Alm Rhyme, MD    "

## 2024-01-24 NOTE — Patient Instructions (Addendum)

## 2024-02-13 ENCOUNTER — Telehealth: Payer: Self-pay

## 2024-02-13 NOTE — Telephone Encounter (Signed)
 Left pt message reminder to bring insurance card

## 2024-02-20 ENCOUNTER — Ambulatory Visit

## 2024-05-10 ENCOUNTER — Ambulatory Visit: Admitting: Dermatology
